# Patient Record
Sex: Male | Born: 1975 | Hispanic: No | Marital: Married | Smoking: Never smoker
Health system: Southern US, Community
[De-identification: ages and names within clinical notes are randomized; demographics above are authoritative.]

## PROBLEM LIST (undated history)

## (undated) DIAGNOSIS — E119 Type 2 diabetes mellitus without complications: Secondary | ICD-10-CM

## (undated) DIAGNOSIS — Z86010 Personal history of colonic polyps: Principal | ICD-10-CM

## (undated) DIAGNOSIS — T7840XA Allergy, unspecified, initial encounter: Secondary | ICD-10-CM

## (undated) HISTORY — DX: Personal history of colonic polyps: Z86.010

## (undated) HISTORY — PX: TONSILLECTOMY: SUR1361

## (undated) HISTORY — DX: Type 2 diabetes mellitus without complications: E11.9

## (undated) HISTORY — DX: Allergy, unspecified, initial encounter: T78.40XA

---

## 2000-11-02 ENCOUNTER — Emergency Department (HOSPITAL_COMMUNITY): Admission: EM | Admit: 2000-11-02 | Discharge: 2000-11-03 | Payer: Self-pay | Admitting: Emergency Medicine

## 2000-11-03 ENCOUNTER — Encounter: Payer: Self-pay | Admitting: Emergency Medicine

## 2008-03-07 ENCOUNTER — Encounter: Admission: RE | Admit: 2008-03-07 | Discharge: 2008-03-07 | Payer: Self-pay | Admitting: Cardiology

## 2008-05-28 ENCOUNTER — Emergency Department (HOSPITAL_COMMUNITY): Admission: EM | Admit: 2008-05-28 | Discharge: 2008-05-28 | Payer: Self-pay | Admitting: Emergency Medicine

## 2009-04-19 ENCOUNTER — Ambulatory Visit: Payer: Self-pay | Admitting: Internal Medicine

## 2009-04-19 DIAGNOSIS — R5381 Other malaise: Secondary | ICD-10-CM | POA: Insufficient documentation

## 2009-04-19 DIAGNOSIS — R5383 Other fatigue: Secondary | ICD-10-CM

## 2009-04-19 LAB — CONVERTED CEMR LAB
ALT: 44 units/L (ref 0–53)
AST: 26 units/L (ref 0–37)
Albumin: 3.8 g/dL (ref 3.5–5.2)
Alkaline Phosphatase: 75 units/L (ref 39–117)
BUN: 18 mg/dL (ref 6–23)
Basophils Absolute: 0 10*3/uL (ref 0.0–0.1)
Basophils Relative: 0 % (ref 0.0–3.0)
Bilirubin Urine: NEGATIVE
Bilirubin, Direct: 0.1 mg/dL (ref 0.0–0.3)
CO2: 30 meq/L (ref 19–32)
Calcium: 8.9 mg/dL (ref 8.4–10.5)
Chloride: 104 meq/L (ref 96–112)
Creatinine, Ser: 0.9 mg/dL (ref 0.4–1.5)
Eosinophils Absolute: 0.2 10*3/uL (ref 0.0–0.7)
Eosinophils Relative: 2 % (ref 0.0–5.0)
GFR calc non Af Amer: 103.58 mL/min (ref >60)
Glucose, Bld: 127 mg/dL — ABNORMAL HIGH (ref 70–99)
HCT: 41.5 % (ref 39.0–52.0)
Hemoglobin, Urine: NEGATIVE
Hemoglobin: 14 g/dL (ref 13.0–17.0)
Ketones, ur: NEGATIVE mg/dL
Leukocytes, UA: NEGATIVE
Lymphocytes Relative: 22 % (ref 12.0–46.0)
Lymphs Abs: 1.8 10*3/uL (ref 0.7–4.0)
MCHC: 33.8 g/dL (ref 30.0–36.0)
MCV: 77.4 fL — ABNORMAL LOW (ref 78.0–100.0)
Monocytes Absolute: 0.6 10*3/uL (ref 0.1–1.0)
Monocytes Relative: 7.8 % (ref 3.0–12.0)
Neutro Abs: 5.5 10*3/uL (ref 1.4–7.7)
Neutrophils Relative %: 68.2 % (ref 43.0–77.0)
Nitrite: NEGATIVE
Platelets: 247 10*3/uL (ref 150.0–400.0)
Potassium: 3.9 meq/L (ref 3.5–5.1)
RBC: 5.37 M/uL (ref 4.22–5.81)
RDW: 12.5 % (ref 11.5–14.6)
Sed Rate: 7 mm/hr (ref 0–22)
Sodium: 138 meq/L (ref 135–145)
Specific Gravity, Urine: 1.025 (ref 1.000–1.030)
TSH: 1.34 microintl units/mL (ref 0.35–5.50)
Total Bilirubin: 0.8 mg/dL (ref 0.3–1.2)
Total Protein, Urine: NEGATIVE mg/dL
Total Protein: 6.9 g/dL (ref 6.0–8.3)
Urine Glucose: NEGATIVE mg/dL
Urobilinogen, UA: 0.2 (ref 0.0–1.0)
WBC: 8.1 10*3/uL (ref 4.5–10.5)
pH: 6 (ref 5.0–8.0)

## 2009-12-10 ENCOUNTER — Ambulatory Visit: Payer: Self-pay | Admitting: Internal Medicine

## 2010-10-01 ENCOUNTER — Encounter: Payer: Self-pay | Admitting: Internal Medicine

## 2010-10-01 ENCOUNTER — Ambulatory Visit: Payer: Self-pay | Admitting: Internal Medicine

## 2010-10-01 DIAGNOSIS — G4730 Sleep apnea, unspecified: Secondary | ICD-10-CM | POA: Insufficient documentation

## 2010-10-01 LAB — CONVERTED CEMR LAB
ALT: 41 units/L (ref 0–53)
AST: 30 units/L (ref 0–37)
Albumin: 4 g/dL (ref 3.5–5.2)
Alkaline Phosphatase: 76 units/L (ref 39–117)
BUN: 15 mg/dL (ref 6–23)
Basophils Absolute: 0 10*3/uL (ref 0.0–0.1)
Basophils Relative: 0.4 % (ref 0.0–3.0)
Bilirubin, Direct: 0 mg/dL (ref 0.0–0.3)
CO2: 30 meq/L (ref 19–32)
Calcium: 9.1 mg/dL (ref 8.4–10.5)
Chloride: 101 meq/L (ref 96–112)
Cholesterol: 193 mg/dL (ref 0–200)
Creatinine, Ser: 0.9 mg/dL (ref 0.4–1.5)
Eosinophils Absolute: 0.5 10*3/uL (ref 0.0–0.7)
Eosinophils Relative: 7.1 % — ABNORMAL HIGH (ref 0.0–5.0)
GFR calc non Af Amer: 101.37 mL/min (ref >60.00)
Glucose, Bld: 102 mg/dL — ABNORMAL HIGH (ref 70–99)
HCT: 42.7 % (ref 39.0–52.0)
HDL: 33.3 mg/dL — ABNORMAL LOW (ref >39.00)
Hemoglobin: 14.5 g/dL (ref 13.0–17.0)
LDL Cholesterol: 136 mg/dL — ABNORMAL HIGH (ref 0–99)
Lymphocytes Relative: 38.9 % (ref 12.0–46.0)
Lymphs Abs: 2.7 10*3/uL (ref 0.7–4.0)
MCHC: 34 g/dL (ref 30.0–36.0)
MCV: 78.3 fL (ref 78.0–100.0)
Monocytes Absolute: 0.6 10*3/uL (ref 0.1–1.0)
Monocytes Relative: 8 % (ref 3.0–12.0)
Neutro Abs: 3.2 10*3/uL (ref 1.4–7.7)
Neutrophils Relative %: 45.6 % (ref 43.0–77.0)
Platelets: 270 10*3/uL (ref 150.0–400.0)
Potassium: 4.8 meq/L (ref 3.5–5.1)
RBC: 5.45 M/uL (ref 4.22–5.81)
RDW: 13.3 % (ref 11.5–14.6)
Sodium: 138 meq/L (ref 135–145)
TSH: 1.46 microintl units/mL (ref 0.35–5.50)
Total Bilirubin: 0.6 mg/dL (ref 0.3–1.2)
Total CHOL/HDL Ratio: 6
Total Protein: 6.9 g/dL (ref 6.0–8.3)
Triglycerides: 119 mg/dL (ref 0.0–149.0)
VLDL: 23.8 mg/dL (ref 0.0–40.0)
WBC: 7 10*3/uL (ref 4.5–10.5)

## 2010-10-25 ENCOUNTER — Ambulatory Visit: Admit: 2010-10-25 | Payer: Self-pay | Admitting: Pulmonary Disease

## 2010-10-30 ENCOUNTER — Encounter: Payer: Self-pay | Admitting: Internal Medicine

## 2010-11-05 NOTE — Assessment & Plan Note (Signed)
Summary: COUGH,COLD,SORE THROAT/CD   Vital Signs:  Patient profile:   35 year old male Height:      71 inches Weight:      209.25 pounds BMI:     29.29 O2 Sat:      97 % on Room air Temp:     98 degrees F oral Pulse rate:   74 / minute Pulse rhythm:   regular Resp:     16 per minute BP sitting:   118 / 80  (left arm) Cuff size:   large  Vitals Entered By: Rock Nephew CMA (December 10, 2009 11:27 AM)  Nutrition Counseling: Patient's BMI is greater than 25 and therefore counseled on weight management options.  O2 Flow:  Room air CC: chills, cough w/ green mucus, bodyache, sore throat, Bilater shoulder pain x several days, URI symptoms   Primary Care Provider:  Etta Grandchild MD  CC:  chills, cough w/ green mucus, bodyache, sore throat, Bilater shoulder pain x several days, and URI symptoms.  History of Present Illness:  URI Symptoms      This is a 35 year old man who presents with URI symptoms.  The symptoms began 3 days ago.  The severity is described as mild.  The patient reports sore throat, productive cough, and sick contacts, but denies nasal congestion, clear nasal discharge, purulent nasal discharge, and earache.  Associated symptoms include low-grade fever (<100.5 degrees).  The patient denies stiff neck, dyspnea, wheezing, rash, vomiting, and diarrhea.  The patient also reports muscle aches.  The patient denies headache and severe fatigue.  The patient denies the following risk factors for Strep sinusitis: unilateral facial pain, unilateral nasal discharge, poor response to decongestant, tooth pain, Strep exposure, tender adenopathy, and absence of cough.    Preventive Screening-Counseling & Management  Alcohol-Tobacco     Alcohol drinks/day: <1     Alcohol type: beer     >5/day in last 3 mos: no     Alcohol Counseling: not indicated; use of alcohol is not excessive or problematic     Feels need to cut down: no     Feels annoyed by complaints: no     Feels guilty  re: drinking: no     Needs 'eye opener' in am: no     Smoking Status: never  Hep-HIV-STD-Contraception     Hepatitis Risk: no risk noted     HIV Risk: no risk noted     STD Risk: no risk noted     TSE monthly: yes      Sexual History:  currently monogamous.        Drug Use:  no.        Blood Transfusions:  no.    Clinical Review Panels:  CBC   WBC:  8.1 (04/19/2009)   RBC:  5.37 (04/19/2009)   Hgb:  14.0 (04/19/2009)   Hct:  41.5 (04/19/2009)   Platelets:  247.0 (04/19/2009)   MCV  77.4 (04/19/2009)   MCHC  33.8 (04/19/2009)   RDW  12.5 (04/19/2009)   PMN:  68.2 (04/19/2009)   Lymphs:  22.0 (04/19/2009)   Monos:  7.8 (04/19/2009)   Eosinophils:  2.0 (04/19/2009)   Basophil:  0.0 (04/19/2009)  Complete Metabolic Panel   Glucose:  127 (04/19/2009)   Sodium:  138 (04/19/2009)   Potassium:  3.9 (04/19/2009)   Chloride:  104 (04/19/2009)   CO2:  30 (04/19/2009)   BUN:  18 (04/19/2009)   Creatinine:  0.9 (04/19/2009)  Albumin:  3.8 (04/19/2009)   Total Protein:  6.9 (04/19/2009)   Calcium:  8.9 (04/19/2009)   Total Bili:  0.8 (04/19/2009)   Alk Phos:  75 (04/19/2009)   SGPT (ALT):  44 (04/19/2009)   SGOT (AST):  26 (04/19/2009)   Medications Prior to Update: 1)  None  Current Medications (verified): 1)  Zithromax Tri-Pak 500 Mg Tab (Azithromycin) .... Take As Directed One By Mouth Once Daily For 3 Days 2)  Guiatuss Ac 100-10 Mg/31ml Syrp (Guaifenesin-Codeine) .... 5-10 Ml By Mouth Qid As Needed For Cough  Allergies (verified): No Known Drug Allergies  Past History:  Past Medical History: Reviewed history from 04/19/2009 and no changes required. Unremarkable  Past Surgical History: Reviewed history from 04/19/2009 and no changes required. Tonsillectomy  Family History: Reviewed history from 04/19/2009 and no changes required. Family History of Colon CA 1st degree relative <60 Family History Diabetes 1st degree relative Family History  Hypertension Family History of Prostate CA 1st degree relative <50  Social History: Reviewed history from 04/19/2009 and no changes required. 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    Physical Exam  General:  alert, well-developed, well-nourished, well-hydrated, appropriate dress, normal appearance, and healthy-appearing.   Head:  normocephalic, atraumatic, no abnormalities observed, and no abnormalities palpated.   Eyes:  vision grossly intact, pupils equal, pupils round, and pupils reactive to light.   Ears:  R ear normal and L ear normal.   Nose:  External nasal examination shows no deformity or inflammation. Nasal mucosa are pink and moist without lesions or exudates. Mouth:  no exudates, no posterior lymphoid hypertrophy, no postnasal drip, no pharyngeal crowing, no lesions, no aphthous ulcers, no erosions, no tongue abnormalities, no leukoplakia, no petechiae, and pharyngeal erythema.   Neck:  supple, full ROM, no masses, no thyromegaly, no JVD, no cervical lymphadenopathy, and no neck tenderness.   Lungs:  normal respiratory effort, no intercostal retractions, no accessory muscle use, normal breath sounds, no dullness, no fremitus, no crackles, and no wheezes.   Heart:  normal rate, regular rhythm, no murmur, no gallop, no rub, and no JVD.   Abdomen:  soft, non-tender, normal bowel sounds, no distention, no masses, no guarding, no rigidity, no rebound tenderness, no hepatomegaly, and no splenomegaly.   Msk:  normal ROM, no joint tenderness, no joint swelling, no joint warmth, no redness over joints, and no joint deformities.   Extremities:  No clubbing, cyanosis, edema, or deformity noted with normal full range of motion of all joints.    Neurologic:  No cranial nerve deficits noted. Station and gait are normal. Plantar reflexes are down-going bilaterally. DTRs are symmetrical throughout. Sensory, motor and coordinative functions appear intact. Skin:  turgor normal, color normal, no rashes, no suspicious lesions, no ecchymoses, no petechiae, no purpura, no ulcerations, and no edema.   Cervical Nodes:  no anterior cervical adenopathy and no posterior cervical adenopathy.   Axillary Nodes:  no R axillary adenopathy and no L axillary adenopathy.   Psych:  Cognition and judgment appear intact. Alert and cooperative with normal attention span and concentration. No apparent delusions, illusions, hallucinations   Impression & Recommendations:  Problem # 1:  BRONCHITIS-ACUTE (ICD-466.0) Assessment New  His updated medication list for this problem includes:    Zithromax Tri-pak 500 Mg Tab (Azithromycin) .Marland KitchenMarland KitchenMarland KitchenMarland Kitchen  Take as directed one by mouth once daily for 3 days    Guiatuss Ac 100-10 Mg/38ml Syrp (Guaifenesin-codeine) .Marland Kitchen... 5-10 ml by mouth qid as needed for cough  Take antibiotics and other medications as directed. Encouraged to push clear liquids, get enough rest, and take acetaminophen as needed. To be seen in 5-7 days if no improvement, sooner if worse.  Complete Medication List: 1)  Zithromax Tri-pak 500 Mg Tab (Azithromycin) .... Take as directed one by mouth once daily for 3 days 2)  Guiatuss Ac 100-10 Mg/81ml Syrp (Guaifenesin-codeine) .... 5-10 ml by mouth qid as needed for cough  Patient Instructions: 1)  Please schedule a follow-up appointment in 2 weeks. 2)  Acute bronchitis symptoms for less than 10 days are not helped by antibiotics. take over the counter cough medications. call if no improvment in  5-7 days, sooner if increasing cough, fever, or new symptoms( shortness of breath, chest pain). Prescriptions: GUIATUSS AC 100-10 MG/5ML SYRP (GUAIFENESIN-CODEINE) 5-10 ml by mouth qid as needed for cough  #6 ounces x 1    Entered and Authorized by:   Etta Grandchild MD   Signed by:   Etta Grandchild MD on 12/10/2009   Method used:   Print then Give to Patient   RxID:   [id number] ZITHROMAX TRI-PAK 500 MG TAB (AZITHROMYCIN) Take as directed one by mouth once daily for 3 days  #3 x 0   Entered and Authorized by:   Etta Grandchild MD   Signed by:   Etta Grandchild MD on 12/10/2009   Method used:   Print then Give to Patient   RxID:   [id number]

## 2010-11-07 NOTE — Letter (Signed)
Summary: Primary Care Appointment Letter  [facility]  [address] [place], [place] [address]   Phone: [phone]  Fax: [phone]    10/30/2010 MRN: [id number]  [facility] [address] [place], [place]  [address]  Dear Mr. KEHOE,   Your Primary Care Physician Etta Grandchild MD has indicated that:    _______it is time to schedule an appointment.    _______you missed your appointment on______ and need to call and          reschedule.    _______you need to have lab work done.    _______you need to schedule an appointment discuss lab or test results.    ____X___you need to call to reschedule your appointment that is                       scheduled on Nov 21, 2010 with Dr.Dustin Bumbaugh.  Please call the office.     Please call our office as soon as possible. Our phone number is [phone]. Please press option 1. Our office is open 8a-12noon and 1p-5p, Monday through Friday.     Thank you,    [facility]

## 2010-11-07 NOTE — Assessment & Plan Note (Signed)
Summary: cpx/#/cd   Vital Signs:  Patient profile:   35 year old male Weight:      207 pounds BMI:     28.97 O2 Sat:      98 % on Room air Temp:     98.1 degrees F oral Pulse rate:   64 / minute Pulse rhythm:   regular Resp:     16 per minute BP sitting:   108 / 72  (left arm) Cuff size:   large  Vitals Entered By: Lamar Sprinkles, CMA (October 01, 2010 10:01 AM)  Nutrition Counseling: Patient's BMI is greater than 25 and therefore counseled on weight management options.  O2 Flow:  Room air CC: "check up", Preventive Care Is Patient Diabetic? No Pain Assessment Patient in pain? no        Primary Care Provider:  Etta Grandchild MD  CC:  "check up" and Preventive Care.  History of Present Illness: He returns for a complete physical but has some complaints.  Preventive Screening-Counseling & Management  Alcohol-Tobacco     Alcohol drinks/day: <1     Alcohol type: beer     >5/day in last 3 mos: no     Alcohol Counseling: not indicated; use of alcohol is not excessive or problematic     Feels need to cut down: no     Feels annoyed by complaints: no     Feels guilty re: drinking: no     Needs 'eye opener' in am: no     Smoking Status: never     Tobacco Counseling: not indicated; no tobacco use  Hep-HIV-STD-Contraception     Hepatitis Risk: no risk noted     HIV Risk: no risk noted     STD Risk: no risk noted     TSE monthly: yes  Safety-Violence-Falls     Seat Belt Use: yes     Helmet Use: n/a     Firearms in the Home: firearms in the home     Smoke Detectors: no     Violence in the Home: no risk noted     Sexual Abuse: no      Sexual History:  currently monogamous.        Drug Use:  no.        Blood Transfusions:  no.    Clinical Review Panels:  Diabetes Management   Creatinine:  0.9 (04/19/2009)  CBC   WBC:  8.1 (04/19/2009)   RBC:  5.37 (04/19/2009)   Hgb:  14.0 (04/19/2009)   Hct:  41.5 (04/19/2009)   Platelets:  247.0 (04/19/2009)   MCV   77.4 (04/19/2009)   MCHC  33.8 (04/19/2009)   RDW  12.5 (04/19/2009)   PMN:  68.2 (04/19/2009)   Lymphs:  22.0 (04/19/2009)   Monos:  7.8 (04/19/2009)   Eosinophils:  2.0 (04/19/2009)   Basophil:  0.0 (04/19/2009)  Complete Metabolic Panel   Glucose:  127 (04/19/2009)   Sodium:  138 (04/19/2009)   Potassium:  3.9 (04/19/2009)   Chloride:  104 (04/19/2009)   CO2:  30 (04/19/2009)   BUN:  18 (04/19/2009)   Creatinine:  0.9 (04/19/2009)   Albumin:  3.8 (04/19/2009)   Total Protein:  6.9 (04/19/2009)   Calcium:  8.9 (04/19/2009)   Total Bili:  0.8 (04/19/2009)   Alk Phos:  75 (04/19/2009)   SGPT (ALT):  44 (04/19/2009)   SGOT (AST):  26 (04/19/2009)   Current Medications (verified): 1)  None  Allergies (verified): No Known Drug  Allergies  Past History:  Past Medical History: Last updated: 04/19/2009 Unremarkable  Past Surgical History: Last updated: 04/19/2009 Tonsillectomy  Family History: Last updated: 04/19/2009 Family History of Colon CA 1st degree relative <60 Family History Diabetes 1st degree relative Family History Hypertension Family History of Prostate CA 1st degree relative <50  Social History: Last updated: 04/19/2009 Occupation: Production designer, 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 Family History of Colon CA 1st degree relative <60 Family History Diabetes 1st degree relative Family History Hypertension Family History of Prostate CA 1st degree relative <50  Social History: Reviewed history from 04/19/2009 and no changes required. 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  The patient denies anorexia, fever, weight loss, chest pain, syncope, dyspnea on exertion, peripheral edema, prolonged cough, headaches, hemoptysis, abdominal pain, melena, hematochezia, severe indigestion/heartburn, hematuria, muscle weakness, suspicious skin lesions, transient blindness, difficulty walking, depression, unusual weight change, abnormal bleeding, enlarged lymph nodes, angioedema, and testicular masses.   Resp:  Complains of excessive snoring, hypersomnolence, and morning headaches; denies chest discomfort, chest pain with inspiration, cough, coughing up blood, pleuritic, shortness of breath, sputum productive, and wheezing.  Physical Exam  General:  alert, well-developed, well-nourished, well-hydrated, appropriate dress, normal appearance, and healthy-appearing.   Head:  normocephalic, atraumatic, no abnormalities observed, and no abnormalities palpated.   Eyes:  vision grossly intact, pupils equal, pupils round, and pupils reactive to light.   Ears:  R ear normal and L ear normal.   Mouth:  no exudates, no posterior lymphoid hypertrophy, no postnasal drip, no pharyngeal crowing, no lesions, no aphthous ulcers, no erosions, no tongue abnormalities, no leukoplakia, no petechiae, and pharyngeal erythema.   Neck:  supple, full ROM, no masses, no thyromegaly, no JVD, no cervical lymphadenopathy, and no neck tenderness.   Chest Wall:  No deformities, masses, tenderness or gynecomastia noted. Breasts:  No masses or gynecomastia noted Lungs:  normal respiratory effort, no intercostal retractions, no accessory muscle use, normal breath sounds, no dullness, no fremitus, no crackles, and no wheezes.   Heart:  normal rate, regular rhythm, no murmur, no gallop, no rub, and no JVD.   Abdomen:  soft, non-tender, normal bowel sounds, no distention, no masses, no guarding, no rigidity, no rebound tenderness, no hepatomegaly, and  no splenomegaly.   Genitalia:  uncircumcised, no hydrocele, no varicocele, no scrotal masses, no testicular masses or atrophy, no cutaneous lesions, and no urethral discharge.   Msk:  No deformity or scoliosis noted of thoracic or lumbar spine.   Pulses:  R and L carotid,radial,femoral,dorsalis pedis and posterior tibial pulses are full and equal bilaterally Extremities:  No clubbing, cyanosis, edema, or deformity noted with normal full range of motion of all joints.   Neurologic:  No cranial nerve deficits noted. Station and gait are normal. Plantar reflexes are down-going bilaterally. DTRs are symmetrical throughout. Sensory, motor and coordinative functions appear intact. Skin:  Intact without suspicious lesions or rashes Cervical Nodes:  no anterior cervical adenopathy and no posterior cervical adenopathy.   Axillary Nodes:  no R axillary adenopathy and no L axillary adenopathy.   Inguinal Nodes:  no R inguinal adenopathy and no L inguinal adenopathy.   Psych:  Cognition and judgment appear intact. Alert and cooperative with normal attention span and concentration. No apparent delusions, illusions, hallucinations   Impression & Recommendations:  Problem # 1:  ROUTINE GENERAL MEDICAL EXAM@HEALTH  CARE FACL (ICD-V70.0) Assessment New  Orders: Venipuncture (82956) TLB-Lipid Panel (80061-LIPID) TLB-BMP (Basic Metabolic Panel-BMET) (80048-METABOL) TLB-CBC Platelet - w/Differential (85025-CBCD) TLB-Hepatic/Liver Function Pnl (80076-HEPATIC) TLB-TSH (Thyroid Stimulating Hormone) (84443-TSH) EKG w/ Interpretation (93000)  Problem # 2:  SLEEP APNEA (ICD-780.57) Assessment: New  Orders: Venipuncture (21308) TLB-Lipid Panel (80061-LIPID) TLB-BMP (Basic Metabolic Panel-BMET) (80048-METABOL) TLB-CBC Platelet - w/Differential (85025-CBCD) TLB-Hepatic/Liver Function Pnl (80076-HEPATIC) TLB-TSH (Thyroid Stimulating Hormone) (84443-TSH) EKG w/ Interpretation (93000) Sleep Disorder Referral  (Sleep Disorder)  Problem # 3:  FAMILY HISTORY OF COLON CA 1ST DEGREE RELATIVE <60 (ICD-V16.0) Assessment: Unchanged he needs a screening colonoscopy Orders: Gastroenterology Referral (GI)  Patient Instructions: 1)  Please schedule a follow-up appointment in 2 months. 2)  It is important that you exercise regularly at least 20 minutes 5 times a week. If you develop chest pain, have severe difficulty breathing, or feel very tired , stop exercising immediately and seek medical attention. 3)  You need to lose weight. Consider a lower calorie diet and regular exercise.  4)  Schedule a colonoscopy/sigmoidoscopy to help detect colon cancer.   Orders Added: 1)  Venipuncture [36415] 2)  TLB-Lipid Panel [80061-LIPID] 3)  TLB-BMP (Basic Metabolic Panel-BMET) [80048-METABOL] 4)  TLB-CBC Platelet - w/Differential [85025-CBCD] 5)  TLB-Hepatic/Liver Function Pnl [80076-HEPATIC] 6)  TLB-TSH (Thyroid Stimulating Hormone) [84443-TSH] 7)  EKG w/ Interpretation [93000] 8)  Sleep Disorder Referral [Sleep Disorder] 9)  Gastroenterology Referral [GI] 10)  Est. Patient 18-39 years [99395] 44)  Est. Patient Level III [65784]

## 2010-11-07 NOTE — Letter (Signed)
Summary: Lipid Letter  [facility]  [address] [place], [place] [address]   Phone: [phone]  Fax: [phone]    10/01/2010  [facility] [place] [address] [place], [place]  [address]  Dear Ninfa Linden:  We have carefully reviewed your last lipid profile from 10/01/2010 and the results are noted below with a summary of recommendations for lipid management.    Cholesterol:       193     Goal: <200   HDL "good" Cholesterol:   57.84     Goal: >40   LDL "bad" Cholesterol:   136     Goal: <130   Triglycerides:       119.0     Goal: <150    blood sugar is a little high, other labs look good    TLC Diet (Therapeutic Lifestyle Change): Saturated Fats & Transfatty acids should be kept < 7% of total calories ***Reduce Saturated Fats Polyunstaurated Fat can be up to 10% of total calories Monounsaturated Fat Fat can be up to 20% of total calories Total Fat should be no greater than 25-35% of total calories Carbohydrates should be 50-60% of total calories Protein should be approximately 15% of total calories Fiber should be at least 20-30 grams a day ***Increased fiber may help lower LDL Total Cholesterol should be < 200mg /day Consider adding plant stanol/sterols to diet (example: Benacol spread) ***A higher intake of unsaturated fat may reduce Triglycerides and Increase HDL    Adjunctive Measures (may lower LIPIDS and reduce risk of Heart Attack) include: Aerobic Exercise (20-30 minutes 3-4 times a week) Limit Alcohol Consumption Weight Reduction Aspirin 75-81 mg a day by mouth (if not allergic or contraindicated) Dietary Fiber 20-30 grams a day by mouth     Current Medications:  None If you have any questions, please call. We appreciate being able to work with you.   Sincerely,    [facility] Etta Grandchild MD

## 2010-11-12 ENCOUNTER — Encounter: Payer: Self-pay | Admitting: Pulmonary Disease

## 2010-11-12 ENCOUNTER — Institutional Professional Consult (permissible substitution) (INDEPENDENT_AMBULATORY_CARE_PROVIDER_SITE_OTHER): Payer: Commercial Managed Care - PPO | Admitting: Pulmonary Disease

## 2010-11-12 DIAGNOSIS — R0989 Other specified symptoms and signs involving the circulatory and respiratory systems: Secondary | ICD-10-CM

## 2010-11-12 DIAGNOSIS — J309 Allergic rhinitis, unspecified: Secondary | ICD-10-CM | POA: Insufficient documentation

## 2010-11-12 DIAGNOSIS — R0609 Other forms of dyspnea: Secondary | ICD-10-CM

## 2010-11-21 NOTE — Assessment & Plan Note (Signed)
Summary: consult for possible osa   Copy to:  Sanda Linger Primary Provider/Referring Provider:  Etta Grandchild MD  CC:  Sleep Consult.  History of Present Illness: The pt is a 34y/o male who I have been asked to see for possible osa.  He has been noted to have loud snoring by his wife, and also an abnormal breathing pattern during sleep.  He denies any choking arousals.  He goes to bed after midnight, and sometimes takes up to an hour to fall asleep.  He starts his day at 630am, and is more unrested than rested when he arises.  He notes decreased concentration and focus during the day with inactivity, especially if reading.  He can sometimes have frank sleepiness.  He denies any issue watching tv or with driving.  His weight is neutral over the last 2 yrs per the pt, and his epworth score today is only 6.    Medications Prior to Update: 1)  None  Allergies (verified): No Known Drug Allergies  Past History:  Past Medical History:   ALLERGIC RHINITIS (ICD-477.9)     Past Surgical History: Reviewed history from 04/19/2009 and no changes required. Tonsillectomy  Family History: Reviewed history from 04/19/2009 and no changes required. Family History of Colon CA 1st degree relative <60.Marland Kitchen.father Family History Diabetes 1st degree relative.....mother Family History Hypertension.....mother Family History of Prostate CA 1st degree relative <50  Social History: Reviewed history from 04/19/2009 and no changes required. 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 Never Smoked Alcohol use-no Drug use-no Regular exercise-yes  Review of Systems       The patient complains of productive cough, headaches, nasal congestion/difficulty breathing through nose, and sneezing.  The patient denies shortness of breath with activity, shortness of breath at rest, non-productive cough, coughing up blood, chest pain, irregular heartbeats, acid heartburn, indigestion, loss of  appetite, weight change, abdominal pain, difficulty swallowing, sore throat, tooth/dental problems, itching, ear ache, anxiety, depression, hand/feet swelling, joint stiffness or pain, rash, change in color of mucus, and fever.    Vital Signs:  Patient profile:   35 year old male Height:      71 inches Weight:      207 pounds BMI:     28.97 O2 Sat:      95 % on Room air Temp:     98.2 degrees F oral Pulse rate:   73 / minute BP sitting:   102 / 80  (left arm) Cuff size:   regular  Vitals Entered By: Arman Filter LPN (November 12, 2010 1:34 PM)  O2 Flow:  Room air  Physical Exam  General:  wd male in nad Eyes:  EOMI.  left pupil much larger than right  both reactive Nose:  deviated septum to left with near obstruction Mouth:  moderate elongation of soft palate and uvula Neck:  no jvd, tmg, LN Lungs:  clear to auscultation. no wheezing or rhonchi Heart:  rrr, no mrg Abdomen:  soft and nontender, bs+ Extremities:  no edema or cyanosis  pulses intact distally Neurologic:  awake, but does appear sleepy. moves all 4.   Impression & Recommendations:  Problem # 1:  SNORING (ICD-786.09) the pt has snoring, witnessed pauses during the night by his wife, and nonrestorative sleep.  He does have some concentration and focus issues during the day, but not a lot of frank sleepiness unless trying to read.  He does not get enough sleep at night, and I have asked him  to work on this.  It is unclear if he has true sleep apnea, and would benefit from sleep testing.  He has no significant medical problems, and would be a good candidate for home study.  The pt is agreeable.  Other Orders: Consultation Level IV ([address]) Misc. Referral (Misc. Ref)  Patient Instructions: 1)  will do home sleep testing to evaluate for sleep apnea. 2)  will arrange for followup once results are available.

## 2010-11-28 ENCOUNTER — Ambulatory Visit: Payer: Self-pay | Admitting: Internal Medicine

## 2010-12-20 ENCOUNTER — Ambulatory Visit (INDEPENDENT_AMBULATORY_CARE_PROVIDER_SITE_OTHER): Payer: Commercial Managed Care - PPO | Admitting: Pulmonary Disease

## 2010-12-20 ENCOUNTER — Encounter: Payer: Self-pay | Admitting: Pulmonary Disease

## 2010-12-20 DIAGNOSIS — R0609 Other forms of dyspnea: Secondary | ICD-10-CM

## 2010-12-30 ENCOUNTER — Ambulatory Visit: Payer: Commercial Managed Care - PPO | Admitting: Internal Medicine

## 2011-01-02 NOTE — Assessment & Plan Note (Signed)
Summary: home sleep testing with AHI 5/hr.    Copy to:  Sanda Linger Primary Provider/Referring Provider:  Etta Grandchild MD   History of Present Illness: The pt underwent home sleep testing with a type 3 monitoring device.  Airflow, pulse rate, oxygen saturations, and respiratory effort were all monitored.  The pt's flow and saturation evaluation periods were only 4hrs and .  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 2) there was oxygen desaturation as low as 79%, with only 1 minute spent the entire night less than or      equal to 88%.   3) average pulse rate during the night was 75.    Allergies: No Known Drug Allergies   Impression & Recommendations:  Problem # 1:  SNORING (ICD-786.09)  the pt has no significant osa by his recent sleep study.  I would encourage him to increase his total sleep time, and to consider surgery if he was concerned about his loud snoring given his upper airway anatomy.   Other Orders: Sleep Std Airflow/Heartrate and O2 SAT unattended ([id number])  Appended Document: home sleep testing with AHI 5/hr.  please let pt know that his sleep study does not show sleep apnea....if he wishes to treat his snoring, can work on mild weight loss, try to stay off his back while sleeping, and can consider surgery given his nasal obstruction on exam.  let me know if he has any questions, and can send him to ent if he wishes to consider surgery.    Appended Document: home sleep testing with AHI 5/hr.  [id number]  Appended Document: home sleep testing with AHI 5/hr.  called and spoke with pt.  pt aware of apnea link results. pt is going to work on weight loss and staying off his back while sleeping.  pt states he will call us back if he would like to be referred to ENT for eval.

## 2011-01-22 ENCOUNTER — Encounter: Payer: Self-pay | Admitting: Pulmonary Disease

## 2011-01-27 ENCOUNTER — Ambulatory Visit (INDEPENDENT_AMBULATORY_CARE_PROVIDER_SITE_OTHER): Payer: Commercial Managed Care - PPO | Admitting: Internal Medicine

## 2011-01-27 ENCOUNTER — Encounter: Payer: Self-pay | Admitting: Internal Medicine

## 2011-01-27 DIAGNOSIS — R143 Flatulence: Secondary | ICD-10-CM

## 2011-01-27 DIAGNOSIS — R141 Gas pain: Secondary | ICD-10-CM

## 2011-01-27 DIAGNOSIS — Z800 Family history of malignant neoplasm of digestive organs: Secondary | ICD-10-CM

## 2011-01-27 DIAGNOSIS — R140 Abdominal distension (gaseous): Secondary | ICD-10-CM | POA: Insufficient documentation

## 2011-01-27 DIAGNOSIS — Z1211 Encounter for screening for malignant neoplasm of colon: Secondary | ICD-10-CM

## 2011-01-27 MED ORDER — PEG-KCL-NACL-NASULF-NA ASC-C 100 G PO SOLR: 1.0000 | ORAL | Status: DC

## 2011-01-27 NOTE — Assessment & Plan Note (Signed)
This started after fiber supplementation was begun.

## 2011-01-27 NOTE — Patient Instructions (Signed)
You have been scheduled for a Colonoscopy, instructions have been provided. Please pick up your prep solution at your pharmacy.  Colonoscopy A colonoscopy is an exam to evaluate your entire colon. In this exam, your colon is cleansed. A long fiberoptic tube is inserted through your rectum and into your colon. The fiberoptic scope (endoscope) is a long bundle of enclosed and very flexible fibers. These fibers transmit light to the area examined and send images from that area to your caregiver. Discomfort is usually minimal. You may be given a drug to help you sleep (sedative) during or prior to the procedure. This exam helps to detect lumps (tumors), polyps, inflammation, and areas of bleeding. Your caregiver may also take a small piece of tissue (biopsy) that will be examined under a microscope. BEFORE THE PROCEDURE  A clear liquid diet may be required for 2 days before the exam.   Liquid injections (enemas) or laxatives may be required.   A large amount of electrolyte solution may be given to you to drink over a short period of time. This solution is used to clean out your colon.   You should be present prior to your procedure or as directed by your caregiver.   Check in at the admissions desk to fill out necessary forms if not preregistered. There will be consent forms to sign prior to the procedure. If accompanied by friends or family, there is a waiting area for them while you are having your procedure.  LET YOUR CAREGIVER KNOW ABOUT:  Allergies to food or medicine.  Medicines taken, including vitamins, herbs, eyedrops, over-the-counter medicines, and creams.   Use of steroids (by mouth or creams).   Previous problems with anesthetics or numbing medicines.   History of bleeding problems or blood clots.  Previous surgery.   Other health problems, including diabetes and kidney problems.   Possibility of pregnancy, if this applies.   AFTER THE PROCEDURE  If you received a sedative  and/or pain medicine, you will need to arrange for someone to drive you home.   Occasionally, there is a little blood passed with the first bowel movement. DO NOT be concerned.  HOME CARE INSTRUCTIONS  It is not unusual to pass moderate amounts of gas and experience mild abdominal cramping following the procedure. This is due to air being used to inflate your colon during the exam. Walking or a warm pack on your belly (abdomen) may help.   You may resume all normal meals and activities after sedatives and medicines have worn off.   Only take over-the-counter or prescription medicines for pain, discomfort, or fever as directed by your caregiver. DO NOT use aspirin or blood thinners if a biopsy was taken. Consult your caregiver for medicine usage if biopsies were taken.  FINDING OUT THE RESULTS OF YOUR TEST Not all test results are available during your visit. If your test results are not back during the visit, make an appointment with your caregiver to find out the results. Do not assume everything is normal if you have not heard from your caregiver or the medical facility. It is important for you to follow up on all of your test results. SEEK IMMEDIATE MEDICAL CARE IF:    You pass large blood clots or fill a toilet with blood following the procedure. This may also occur 10 to 14 days following the procedure. This is more likely if a biopsy was taken.   You develop abdominal pain that keeps getting worse and cannot be relieved  with medicine.  Document Released: 09/19/2000 Document Re-Released: 12/17/2009 [facility] Patient Information 2011 [place], [place].

## 2011-01-27 NOTE — Progress Notes (Signed)
History of Present Illness: This is a 35 year old man originally from [place]. About 2 months ago he initiated over-the-counter fiber supplementation as had some intermittent bloating and abdominal distention since then. This lasts a few hours and then seems to be relieved by passing flatus. He has no other GI problems at this time. His father died around the age of 51 from colon cancer. This was diagnosed in around. The patient is concerned about his chance of developing colon cancer.    Review of Systems: Pertinent positive and negative review of systems were noted in the above HPI section. All other review of systems were otherwise negative.    Current Medications, Allergies, Past Medical History, Past Surgical History, Family History and Social History were reviewed in [employer] record.   Physical Exam: General: Well developed , well nourished, no acute distress Head: Normocephalic and atraumatic Eyes:  sclerae anicteric Lungs: Clear throughout to auscultation Heart: Regular rate and rhythm; no murmurs, rubs or bruits Abdomen: Soft, non tender and non distended. No masses, hepatosplenomegaly or hernias noted. Normal Bowel sounds Rectal:  Deferred until colonoscopy   Assessment and Recommendations:  #1  Abdominal distention: I think this is clearly related to the fiber at this point. He may need to stop this. He was not constipated I think this was a concern about protecting himself from colon cancer.  #2 Family history of colon cancer in a relative less than 60, it was his father. He is at an appropriate point to have a screening colonoscopy given his increased risk. We will schedule this. Risks benefits indications are explained he understands and agrees to proceed.

## 2011-01-28 ENCOUNTER — Encounter: Payer: Self-pay | Admitting: Internal Medicine

## 2011-02-05 ENCOUNTER — Ambulatory Visit (INDEPENDENT_AMBULATORY_CARE_PROVIDER_SITE_OTHER): Payer: Commercial Managed Care - PPO | Admitting: Internal Medicine

## 2011-02-05 ENCOUNTER — Encounter: Payer: Self-pay | Admitting: Internal Medicine

## 2011-02-05 VITALS — BP 120/80 | HR 80 | Temp 98.3°F | Resp 16 | Wt 205.0 lb

## 2011-02-05 DIAGNOSIS — J309 Allergic rhinitis, unspecified: Secondary | ICD-10-CM

## 2011-02-05 MED ORDER — MOMETASONE FUROATE 50 MCG/ACT NA SUSP: 2.0000 | Freq: Every day | NASAL | Status: DC

## 2011-02-05 NOTE — Progress Notes (Signed)
Subjective:    Patient ID: Marcus Schultz, male    DOB: 11/16/1975, 35 y.o.   MRN: [id number]  Allergic Reaction This is a chronic problem. The current episode started more than 1 week ago. The problem occurs constantly. The problem is unchanged. The problem is moderate. It is unknown what he was exposed to. The time of exposure is not relevant (no exposure). Associated symptoms include eye itching and eye redness. Pertinent negatives include no abdominal pain, chest pain, chest pressure, coughing, diarrhea, difficulty breathing, drooling, eye watering, globus sensation, hyperventilation, itching, rash, stridor, trouble swallowing, vomiting or wheezing. There is no swelling present. Past treatments include one or more OTC medications. The treatment provided no relief. His past medical history is significant for seasonal allergies.      Review of Systems  Constitutional: Negative for fever, chills, diaphoresis, activity change, appetite change, fatigue and unexpected weight change.  HENT: Positive for congestion, rhinorrhea, sneezing and postnasal drip. Negative for hearing loss, nosebleeds, sore throat, facial swelling, drooling, trouble swallowing, neck pain, neck stiffness, voice change, sinus pressure and tinnitus.   Eyes: Positive for redness and itching. Negative for photophobia, pain, discharge and visual disturbance.  Respiratory: Negative for apnea, cough, choking, chest tightness, shortness of breath, wheezing and stridor.   Cardiovascular: Negative for chest pain, palpitations and leg swelling.  Gastrointestinal: Negative for nausea, vomiting, abdominal pain, diarrhea, constipation and blood in stool.  Musculoskeletal: Negative for myalgias, back pain, joint swelling, arthralgias and gait problem.  Skin: Negative for itching and rash.  Neurological: Negative for dizziness, seizures, facial asymmetry, light-headedness, numbness and headaches.  Hematological: Negative for adenopathy. Does  not bruise/bleed easily.  Psychiatric/Behavioral: Negative for hallucinations, behavioral problems, confusion, self-injury, dysphoric mood, decreased concentration and agitation. The patient is not nervous/anxious and is not hyperactive.        Objective:   Physical Exam  [vitalsreviewed. Constitutional: He is oriented to person, place, and time. He appears well-developed and well-nourished. No distress.  HENT:  Head: Normocephalic and atraumatic.  Right Ear: External ear normal.  Left Ear: External ear normal.  Nose: Mucosal edema and rhinorrhea present. No nose lacerations or sinus tenderness. No epistaxis.  No foreign bodies. Right sinus exhibits no maxillary sinus tenderness and no frontal sinus tenderness. Left sinus exhibits no maxillary sinus tenderness and no frontal sinus tenderness.  Mouth/Throat: Oropharynx is clear and moist. No oropharyngeal exudate.  Eyes: Conjunctivae and EOM are normal. Pupils are equal, round, and reactive to light. Right eye exhibits no discharge. Left eye exhibits no discharge. No scleral icterus.  Neck: Normal range of motion. Neck supple. No JVD present. No tracheal deviation present. No thyromegaly present.  Cardiovascular: Normal rate, regular rhythm, normal heart sounds and intact distal pulses.  Exam reveals no gallop and no friction rub.   No murmur heard. Pulmonary/Chest: Effort normal and breath sounds normal. No stridor. No respiratory distress. He has no wheezes. He has no rales. He exhibits no tenderness.  Abdominal: Soft. Bowel sounds are normal. He exhibits no distension and no mass. There is no tenderness. There is no rebound and no guarding.  Musculoskeletal: Normal range of motion. He exhibits no edema and no tenderness.  Lymphadenopathy:    He has no cervical adenopathy.  Neurological: He is alert and oriented to person, place, and time. He has normal reflexes. He displays normal reflexes. No cranial nerve deficit. He exhibits normal  muscle tone. Coordination normal.  Skin: Skin is warm and dry. No rash noted. He is not diaphoretic.  No erythema. No pallor.  Psychiatric: He has a normal mood and affect. His behavior is normal. Judgment and thought content normal.       Lab Results  Component Value Date   WBC 7.0 10/01/2010   HGB 14.5 10/01/2010   HCT 42.7 10/01/2010   PLT 270.0 10/01/2010   CHOL 193 10/01/2010   TRIG 119.0 10/01/2010   HDL 33.30* 10/01/2010   ALT 41 10/01/2010   AST 30 10/01/2010   NA 138 10/01/2010   K 4.8 10/01/2010   CL 101 10/01/2010   CREATININE 0.9 10/01/2010   BUN 15 10/01/2010   CO2 30 10/01/2010   TSH 1.46 10/01/2010     Assessment & Plan:

## 2011-02-05 NOTE — Assessment & Plan Note (Signed)
He was given an injection of depo-medrol IM and started on a steroid nasal spray

## 2011-02-05 NOTE — Patient Instructions (Signed)
Allergic Rhinitis Allergic rhinitis is when the mucous membranes in the nose respond to allergens. Allergens are particles in the air that cause your body to have an allergic reaction. This causes you to release allergic antibodies. Through a chain of events, these eventually cause you to release histamine into the blood stream (hence the use of antihistamines). Although meant to be protective to the body, it is this release that causes your discomfort, such as frequent sneezing, congestion and an itchy runny nose.  CAUSES The pollen allergens may come from grasses, trees, and weeds. This is seasonal allergic rhinitis, or "hay fever." Other allergens cause year-round allergic rhinitis (perennial allergic rhinitis) such as house dust mite allergen, pet dander and mold spores.  SYMPTOMS  Nasal stuffiness (congestion).   Runny, itchy nose with sneezing and tearing of the eyes.   There is often an itching of the mouth, eyes and ears.  It cannot be cured, but it can be controlled with medications. DIAGNOSIS If you are unable to determine the offending allergen, skin or blood testing may find it. TREATMENT  Avoid the allergen.   Medications and allergy shots (immunotherapy) can help.   Hay fever may often be treated with antihistamines in pill or nasal spray forms. Antihistamines block the effects of histamine. There are over-the-counter medicines that may help with nasal congestion and swelling around the eyes. Check with your caregiver before taking or giving this medicine.  If the treatment above does not work, there are many new medications your caregiver can prescribe. Stronger medications may be used if initial measures are ineffective. Desensitizing injections can be used if medications and avoidance fails. Desensitization is when a patient is given ongoing shots until the body becomes less sensitive to the allergen. Make sure you follow up with your caregiver if problems continue. SEEK  MEDICAL CARE IF:   You develop fever (more than 100.5F (38.1 C).   You develop a cough that does not stop easily (persistent).   You have shortness of breath.   You start wheezing.   Symptoms interfere with normal daily activities.  Document Released: 06/17/2001 Document Re-Released: 10/14/2009 [employer] Patient Information 2011 [employer]. 

## 2011-02-07 DIAGNOSIS — Z860100 Personal history of colon polyps, unspecified: Secondary | ICD-10-CM

## 2011-02-07 HISTORY — DX: Personal history of colonic polyps: Z86.010

## 2011-02-07 HISTORY — DX: Personal history of colon polyps, unspecified: Z86.0100

## 2011-02-11 ENCOUNTER — Telehealth: Payer: Self-pay | Admitting: *Deleted

## 2011-02-11 ENCOUNTER — Encounter: Payer: Self-pay | Admitting: Gastroenterology

## 2011-02-11 MED ORDER — PEG-KCL-NACL-NASULF-NA ASC-C 100 G PO SOLR: 1.0000 | Freq: Once | ORAL | Status: AC

## 2011-02-11 NOTE — Telephone Encounter (Signed)
Pt's colonoscopy scheduled in error.  Spoke with pt, who needs Moviprep sent to [facility] on [address].  Rx sent per pt request.  Also, pt rescheduled to 2:30 on 02-12-11 with Dr. Leone Payor.  Pt is not to be a Propofol pt and Dr. Leone Payor aware.  Pt given instructions re: new times to drink prep and to stop drinking clear liquids and understanding voiced.  Pt to be here at 1:30 p.m.  On 02-12-11.

## 2011-02-12 ENCOUNTER — Other Ambulatory Visit: Payer: Commercial Managed Care - PPO | Admitting: Gastroenterology

## 2011-02-12 ENCOUNTER — Other Ambulatory Visit: Payer: Commercial Managed Care - PPO | Admitting: Internal Medicine

## 2011-02-14 ENCOUNTER — Encounter: Payer: Self-pay | Admitting: Internal Medicine

## 2011-02-17 ENCOUNTER — Ambulatory Visit (AMBULATORY_SURGERY_CENTER): Payer: Commercial Managed Care - PPO | Admitting: Internal Medicine

## 2011-02-17 ENCOUNTER — Encounter: Payer: Self-pay | Admitting: Internal Medicine

## 2011-02-17 VITALS — BP 123/77 | HR 61 | Temp 97.4°F | Resp 20

## 2011-02-17 DIAGNOSIS — R140 Abdominal distension (gaseous): Secondary | ICD-10-CM

## 2011-02-17 DIAGNOSIS — D126 Benign neoplasm of colon, unspecified: Secondary | ICD-10-CM

## 2011-02-17 DIAGNOSIS — Z1211 Encounter for screening for malignant neoplasm of colon: Secondary | ICD-10-CM

## 2011-02-17 DIAGNOSIS — Z800 Family history of malignant neoplasm of digestive organs: Secondary | ICD-10-CM

## 2011-02-17 HISTORY — PX: COLONOSCOPY W/ POLYPECTOMY: SHX1380

## 2011-02-17 LAB — HM COLONOSCOPY

## 2011-02-17 MED ORDER — SODIUM CHLORIDE 0.9 % IV SOLN: 500.0000 mL | INTRAVENOUS | Status: DC

## 2011-02-18 ENCOUNTER — Telehealth: Payer: Self-pay

## 2011-02-18 ENCOUNTER — Telehealth: Payer: Self-pay | Admitting: Internal Medicine

## 2011-02-18 NOTE — Telephone Encounter (Signed)
I called the pt's home and cell #.  No ID on the recordings.  No message left. MAW

## 2011-02-18 NOTE — Telephone Encounter (Signed)
Advised pt to drink something warm to be sure all air has passed, wait a couple of hours then call again if still no improvement.

## 2011-02-25 NOTE — Progress Notes (Signed)
Quick Note:  2 adenomas (tubular and serrated + family hx colon cancer Repeat colonoscopy May 2017 ______

## 2011-07-11 ENCOUNTER — Telehealth: Payer: Self-pay

## 2011-07-11 NOTE — Telephone Encounter (Signed)
[facility] requesting referral for PT. Thanks

## 2011-07-11 NOTE — Telephone Encounter (Signed)
What is the diagnosis? 

## 2011-07-14 ENCOUNTER — Ambulatory Visit: Payer: Commercial Managed Care - PPO | Admitting: Physical Therapy

## 2011-07-14 NOTE — Telephone Encounter (Signed)
Returned call back to [facility] w/ re[facility] who advised that pt wife is requesting the referral and was not able to provide any info. I asked per MD what is dx, Earnest Conroy was not sure and therefore stated that nothing will be required at this time. Closing phone note

## 2012-09-06 ENCOUNTER — Ambulatory Visit: Payer: Commercial Managed Care - PPO | Admitting: Internal Medicine

## 2012-09-08 ENCOUNTER — Other Ambulatory Visit (INDEPENDENT_AMBULATORY_CARE_PROVIDER_SITE_OTHER): Payer: Commercial Managed Care - PPO

## 2012-09-08 ENCOUNTER — Encounter: Payer: Self-pay | Admitting: Internal Medicine

## 2012-09-08 ENCOUNTER — Ambulatory Visit (INDEPENDENT_AMBULATORY_CARE_PROVIDER_SITE_OTHER)
Admission: RE | Admit: 2012-09-08 | Discharge: 2012-09-08 | Disposition: A | Discharge status: Home / Self Care | Payer: Commercial Managed Care - PPO | Source: Ambulatory Visit | Attending: Internal Medicine | Admitting: Internal Medicine

## 2012-09-08 ENCOUNTER — Ambulatory Visit (INDEPENDENT_AMBULATORY_CARE_PROVIDER_SITE_OTHER): Payer: Commercial Managed Care - PPO | Admitting: Internal Medicine

## 2012-09-08 VITALS — BP 118/60 | HR 88 | Temp 97.6°F | Resp 16 | Ht 70.0 in | Wt 219.2 lb

## 2012-09-08 DIAGNOSIS — R109 Unspecified abdominal pain: Secondary | ICD-10-CM | POA: Insufficient documentation

## 2012-09-08 DIAGNOSIS — R7309 Other abnormal glucose: Secondary | ICD-10-CM

## 2012-09-08 DIAGNOSIS — Z23 Encounter for immunization: Secondary | ICD-10-CM

## 2012-09-08 DIAGNOSIS — E119 Type 2 diabetes mellitus without complications: Secondary | ICD-10-CM | POA: Insufficient documentation

## 2012-09-08 LAB — HM DIABETES FOOT EXAM: HM Diabetic Foot Exam: NORMAL

## 2012-09-08 LAB — URINALYSIS, ROUTINE W REFLEX MICROSCOPIC
Ketones, ur: NEGATIVE
Specific Gravity, Urine: 1.025 (ref 1.000–1.030)
Total Protein, Urine: NEGATIVE
Urine Glucose: NEGATIVE
pH: 6 (ref 5.0–8.0)

## 2012-09-08 LAB — CBC WITH DIFFERENTIAL/PLATELET
Basophils Absolute: 0 10*3/uL (ref 0.0–0.1)
Basophils Relative: 0.4 % (ref 0.0–3.0)
Eosinophils Absolute: 0.3 10*3/uL (ref 0.0–0.7)
Lymphocytes Relative: 38.9 % (ref 12.0–46.0)
MCHC: 32.5 g/dL (ref 30.0–36.0)
MCV: 77.9 fl — ABNORMAL LOW (ref 78.0–100.0)
Monocytes Absolute: 0.6 10*3/uL (ref 0.1–1.0)
Neutro Abs: 3.1 10*3/uL (ref 1.4–7.7)
Neutrophils Relative %: 47.2 % (ref 43.0–77.0)
RDW: 13.4 % (ref 11.5–14.6)

## 2012-09-08 LAB — COMPREHENSIVE METABOLIC PANEL
ALT: 58 U/L — ABNORMAL HIGH (ref 0–53)
AST: 37 U/L (ref 0–37)
Albumin: 3.9 g/dL (ref 3.5–5.2)
Alkaline Phosphatase: 70 U/L (ref 39–117)
Calcium: 8.6 mg/dL (ref 8.4–10.5)
Chloride: 101 mEq/L (ref 96–112)
Potassium: 4.3 mEq/L (ref 3.5–5.1)
Sodium: 137 mEq/L (ref 135–145)

## 2012-09-08 LAB — HEMOGLOBIN A1C: Hgb A1c MFr Bld: 7.5 % — ABNORMAL HIGH (ref 4.6–6.5)

## 2012-09-08 NOTE — Progress Notes (Signed)
Subjective:    Patient ID: Marcus Schultz, male    DOB: 1976/04/23, 36 y.o.   MRN: [id number]  Flank Pain This is a recurrent problem. The current episode started more than 1 month ago. The problem occurs intermittently. The problem is unchanged. Pain location: bilaterlal posterior flank area. The quality of the pain is described as burning. The pain does not radiate. The pain is at a severity of 1/10. The pain is mild. The pain is worse during the day. Pertinent negatives include no abdominal pain, bladder incontinence, bowel incontinence, chest pain, dysuria, fever, headaches, leg pain, numbness, paresis, paresthesias, pelvic pain, perianal numbness, tingling, weakness or weight loss. Risk factors include obesity. He has tried NSAIDs for the symptoms. The treatment provided significant relief.      Review of Systems  Constitutional: Positive for unexpected weight change (some weight gain). Negative for fever, chills, weight loss, diaphoresis, activity change, appetite change and fatigue.  HENT: Negative.   Eyes: Negative.   Respiratory: Negative for cough, chest tightness, shortness of breath, wheezing and stridor.   Cardiovascular: Negative for chest pain, palpitations and leg swelling.  Gastrointestinal: Negative for nausea, vomiting, abdominal pain, diarrhea, constipation, blood in stool, abdominal distention, anal bleeding, rectal pain and bowel incontinence.  Genitourinary: Positive for flank pain. Negative for bladder incontinence, dysuria, urgency, hematuria, decreased urine volume, enuresis, testicular pain and pelvic pain.  Musculoskeletal: Negative for myalgias, back pain, joint swelling, arthralgias and gait problem.  Skin: Negative.   Neurological: Negative for dizziness, tingling, tremors, seizures, syncope, facial asymmetry, speech difficulty, weakness, light-headedness, numbness, headaches and paresthesias.  Hematological: Negative for adenopathy. Does not bruise/bleed easily.   Psychiatric/Behavioral: Negative.        Objective:   Physical Exam  Vitals reviewed. Constitutional: He is oriented to person, place, and time. Vital signs are normal. He appears well-developed and well-nourished.  Non-toxic appearance. He does not have a sickly appearance. He does not appear ill. No distress.  HENT:  Head: Normocephalic and atraumatic.  Mouth/Throat: Oropharynx is clear and moist. No oropharyngeal exudate.  Eyes: Conjunctivae normal are normal. Right eye exhibits no discharge. Left eye exhibits no discharge. No scleral icterus.  Neck: Normal range of motion. Neck supple. No JVD present. No tracheal deviation present. No thyromegaly present.  Cardiovascular: Normal rate, regular rhythm, normal heart sounds and intact distal pulses.  Exam reveals no gallop and no friction rub.   No murmur heard. Pulmonary/Chest: Effort normal and breath sounds normal. No stridor. No respiratory distress. He has no wheezes. He has no rales. He exhibits no tenderness.  Abdominal: Soft. Normal appearance and bowel sounds are normal. He exhibits no shifting dullness, no distension, no pulsatile liver, no fluid wave, no abdominal bruit, no ascites, no pulsatile midline mass and no mass. There is no hepatosplenomegaly. There is no tenderness. There is no rigidity, no rebound, no guarding, no CVA tenderness, no tenderness at McBurney's point and negative Murphy's sign. No hernia.  Musculoskeletal: Normal range of motion. He exhibits no edema and no tenderness.  Lymphadenopathy:    He has no cervical adenopathy.  Neurological: He is oriented to person, place, and time.  Skin: Skin is warm and dry. No rash noted. He is not diaphoretic. No erythema. No pallor.  Psychiatric: He has a normal mood and affect. His behavior is normal. Judgment and thought content normal.      Lab Results  Component Value Date   WBC 7.0 10/01/2010   HGB 14.5 10/01/2010   HCT 42.7 10/01/2010  PLT 270.0 10/01/2010    GLUCOSE 102* 10/01/2010   CHOL 193 10/01/2010   TRIG 119.0 10/01/2010   HDL 33.30* 10/01/2010   LDLCALC 136* 10/01/2010   ALT 41 10/01/2010   AST 30 10/01/2010   NA 138 10/01/2010   K 4.8 10/01/2010   CL 101 10/01/2010   CREATININE 0.9 10/01/2010   BUN 15 10/01/2010   CO2 30 10/01/2010   TSH 1.46 10/01/2010      Assessment & Plan:

## 2012-09-08 NOTE — Assessment & Plan Note (Signed)
I will check his A1C to see if he has developed DM II 

## 2012-09-08 NOTE — Patient Instructions (Signed)
Flank Pain  Flank pain refers to pain that is located on the side of the body between the upper abdomen and the back. It can be caused by many things.  CAUSES   Some of the more common causes of flank pain include:   Muscle strain.   Muscle spasms.   A disease of your spine (vertebral disk disease).   A lung infection (pneumonia).   Fluid around your lungs (pulmonary edema).   A kidney infection.   Kidney stones.   A very painful skin rash on only one side of your body (shingles).   Gallbladder disease.  DIAGNOSIS   Blood tests, urine tests, and X-rays may help your caregiver determine what is wrong.  TREATMENT   The treatment of pain depends on the cause. Your caregiver will determine what treatment will work best for you.  HOME CARE INSTRUCTIONS    Home care will depend on the cause of your pain.   Some medications may help relieve the pain. Take medication for relief of pain as directed by your caregiver.   Tell your caregiver about any changes in your pain.   Follow up with your caregiver.  SEEK IMMEDIATE MEDICAL CARE IF:    Your pain is not controlled with medication.   The pain increases.   You have abdominal pain.   You have shortness of breath.   You have persistent nausea or vomiting.   You have swelling in your abdomen.   You feel faint or pass out.   You have a temperature by mouth above 102 F (38.9 C), not controlled by medicine.  MAKE SURE YOU:    Understand these instructions.   Will watch your condition.   Will get help right away if you are not doing well or get worse.  Document Released: 11/13/2005 Document Revised: 12/15/2011 Document Reviewed: 03/09/2010  [employer] Patient Information 2013 [employer].

## 2012-09-08 NOTE — Assessment & Plan Note (Signed)
His pain sounds musculoskeletal He'll nsaids for pain I will check his labs today to see if there is an organic illness to explain his pain Will check a plain film of the abd to see if there are any stones

## 2012-09-13 ENCOUNTER — Encounter: Payer: Self-pay | Admitting: Internal Medicine

## 2012-09-13 ENCOUNTER — Other Ambulatory Visit (INDEPENDENT_AMBULATORY_CARE_PROVIDER_SITE_OTHER): Payer: Commercial Managed Care - PPO

## 2012-09-13 ENCOUNTER — Ambulatory Visit (INDEPENDENT_AMBULATORY_CARE_PROVIDER_SITE_OTHER): Payer: Commercial Managed Care - PPO | Admitting: Internal Medicine

## 2012-09-13 VITALS — BP 109/70 | HR 77 | Temp 97.6°F | Resp 16 | Ht 70.0 in | Wt 215.0 lb

## 2012-09-13 DIAGNOSIS — IMO0001 Reserved for inherently not codable concepts without codable children: Secondary | ICD-10-CM

## 2012-09-13 LAB — BASIC METABOLIC PANEL
BUN: 20 mg/dL (ref 6–23)
CO2: 26 mEq/L (ref 19–32)
Chloride: 104 mEq/L (ref 96–112)
GFR: 93.1 mL/min (ref >60.00)
Glucose, Bld: 101 mg/dL — ABNORMAL HIGH (ref 70–99)
Potassium: 4 mEq/L (ref 3.5–5.1)
Sodium: 137 mEq/L (ref 135–145)

## 2012-09-13 MED ORDER — SITAGLIPTIN PHOSPHATE 100 MG PO TABS: 100.0000 mg | ORAL_TABLET | Freq: Every day | ORAL | Status: DC

## 2012-09-13 NOTE — Progress Notes (Signed)
  Subjective:    Patient ID: Marcus Schultz, male    DOB: 06-22-1976, 36 y.o.   MRN: [id number]  Diabetes He presents for his initial diabetic visit. He has type 2 diabetes mellitus. The initial diagnosis of diabetes was made 1 month ago. His disease course has been worsening. There are no hypoglycemic associated symptoms. Pertinent negatives for diabetes include no blurred vision, no chest pain, no fatigue, no foot paresthesias, no foot ulcerations, no polydipsia, no polyphagia, no polyuria, no visual change, no weakness and no weight loss. There are no hypoglycemic complications. Symptoms are stable. There are no diabetic complications. When asked about current treatments, none were reported. His weight is stable. He is following a generally unhealthy diet. When asked about meal planning, he reported none. He has not had a previous visit with a dietician. He participates in exercise intermittently. There is no change in his home blood glucose trend. An ACE inhibitor/angiotensin II receptor blocker is not being taken. He does not see a podiatrist.Eye exam is not current.      Review of Systems  Constitutional: Negative.  Negative for weight loss and fatigue.  HENT: Negative.   Eyes: Negative.  Negative for blurred vision.  Respiratory: Negative.   Cardiovascular: Negative.  Negative for chest pain.  Gastrointestinal: Negative.   Genitourinary: Negative.  Negative for polyuria.  Musculoskeletal: Negative.   Skin: Negative.   Neurological: Negative.  Negative for weakness.  Hematological: Negative.  Negative for polydipsia and polyphagia.  Psychiatric/Behavioral: Negative.        Objective:   Physical Exam  Vitals reviewed. Constitutional: He is oriented to person, place, and time. He appears well-developed and well-nourished. No distress.  HENT:  Head: Normocephalic and atraumatic.  Mouth/Throat: No oropharyngeal exudate.  Eyes: Conjunctivae normal are normal. Right eye exhibits no  discharge. Left eye exhibits no discharge. No scleral icterus.  Neck: Normal range of motion. Neck supple. No JVD present. No tracheal deviation present. No thyromegaly present.  Cardiovascular: Normal rate, regular rhythm, normal heart sounds and intact distal pulses.  Exam reveals no gallop and no friction rub.   No murmur heard. Pulmonary/Chest: Effort normal and breath sounds normal. No stridor. No respiratory distress. He has no wheezes. He has no rales. He exhibits no tenderness.  Abdominal: Soft. Bowel sounds are normal. He exhibits no distension and no mass. There is no tenderness. There is no rebound and no guarding.  Musculoskeletal: Normal range of motion. He exhibits no edema and no tenderness.  Lymphadenopathy:    He has no cervical adenopathy.  Neurological: He is oriented to person, place, and time.  Skin: Skin is warm and dry. No rash noted. He is not diaphoretic. No erythema. No pallor.  Psychiatric: He has a normal mood and affect. His behavior is normal. Judgment and thought content normal.      Lab Results  Component Value Date   WBC 6.6 09/08/2012   HGB 14.2 09/08/2012   HCT 43.6 09/08/2012   PLT 271.0 09/08/2012   GLUCOSE 191* 09/08/2012   CHOL 193 10/01/2010   TRIG 119.0 10/01/2010   HDL 33.30* 10/01/2010   LDLCALC 136* 10/01/2010   ALT 58* 09/08/2012   AST 37 09/08/2012   NA 137 09/08/2012   K 4.3 09/08/2012   CL 101 09/08/2012   CREATININE 0.8 09/08/2012   BUN 14 09/08/2012   CO2 30 09/08/2012   TSH 1.46 10/01/2010   HGBA1C 7.5* 09/08/2012      Assessment & Plan:

## 2012-09-13 NOTE — Patient Instructions (Addendum)

## 2012-09-14 ENCOUNTER — Encounter: Payer: Self-pay | Admitting: Internal Medicine

## 2012-09-14 NOTE — Assessment & Plan Note (Signed)
Start Januvia Check a c-peptide today ( is he more of a type I or II? ) Refer for eye exam and nutrition/education Pt ed material was given

## 2012-09-16 ENCOUNTER — Telehealth: Payer: Self-pay

## 2012-09-16 NOTE — Telephone Encounter (Signed)
Patient called [facility] stating that he had diabetic testing that would determine which type of diabetes. Please advise on lab results Thanks

## 2012-09-16 NOTE — Telephone Encounter (Signed)
He is midway between a type I and a type II, he may need insulin soon

## 2012-09-16 NOTE — Telephone Encounter (Signed)
Patient notified per MD//[facility] to call back to set appt to start treatment

## 2012-09-20 ENCOUNTER — Ambulatory Visit (INDEPENDENT_AMBULATORY_CARE_PROVIDER_SITE_OTHER): Payer: Commercial Managed Care - PPO | Admitting: Internal Medicine

## 2012-09-20 ENCOUNTER — Encounter: Payer: Self-pay | Admitting: Internal Medicine

## 2012-09-20 VITALS — BP 104/60 | HR 73 | Temp 98.4°F | Resp 16 | Wt 212.0 lb

## 2012-09-20 DIAGNOSIS — IMO0001 Reserved for inherently not codable concepts without codable children: Secondary | ICD-10-CM

## 2012-09-20 NOTE — Assessment & Plan Note (Signed)
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 For now he will stay on januvia and I will monitor him for the need to start insulin.

## 2012-09-20 NOTE — Progress Notes (Signed)
Subjective:    Patient ID: Marcus Schultz, male    DOB: [birth year], 36 y.o.   MRN: [id number]  Diabetes He presents for his follow-up diabetic visit. He has type 2 diabetes mellitus. His disease course has been improving. There are no hypoglycemic associated symptoms. Pertinent negatives for diabetes include no blurred vision, no chest pain, no fatigue, no foot paresthesias, no foot ulcerations, no polydipsia, no polyphagia, no polyuria, no visual change, no weakness and no weight loss. There are no hypoglycemic complications. There are no diabetic complications. Current diabetic treatment includes oral agent (monotherapy). He is compliant with treatment all of the time. His weight is decreasing steadily. He is following a generally healthy diet. Meal planning includes avoidance of concentrated sweets. He has not had a previous visit with a dietician. He participates in exercise three times a week. His home blood glucose trend is decreasing steadily. His breakfast blood glucose range is generally 70-90 mg/dl. His lunch blood glucose range is generally 90-110 mg/dl. His dinner blood glucose range is generally 90-110 mg/dl. His highest blood glucose is 90-110 mg/dl. His overall blood glucose range is 90-110 mg/dl. An ACE inhibitor/angiotensin II receptor blocker is contraindicated. He does not see a podiatrist.Eye exam is not current.      Review of Systems  Constitutional: Negative.  Negative for weight loss and fatigue.  HENT: Negative.   Eyes: Negative.  Negative for blurred vision.  Respiratory: Negative.   Cardiovascular: Negative.  Negative for chest pain.  Gastrointestinal: Negative.   Genitourinary: Negative.  Negative for polyuria.  Musculoskeletal: Negative.   Skin: Negative.   Neurological: Negative.  Negative for weakness.  Hematological: Negative.  Negative for polydipsia and polyphagia.  Psychiatric/Behavioral: Negative.        Objective:   Physical Exam  Vitals  reviewed. Constitutional: He is oriented to person, place, and time. He appears well-developed and well-nourished. No distress.  HENT:  Head: Normocephalic and atraumatic.  Mouth/Throat: Oropharynx is clear and moist. No oropharyngeal exudate.  Eyes: Conjunctivae normal are normal. Right eye exhibits no discharge. Left eye exhibits no discharge. No scleral icterus.  Neck: Normal range of motion. Neck supple. No JVD present. No tracheal deviation present. No thyromegaly present.  Cardiovascular: Normal rate, regular rhythm, normal heart sounds and intact distal pulses.  Exam reveals no gallop and no friction rub.   No murmur heard. Pulmonary/Chest: Effort normal and breath sounds normal. No stridor. No respiratory distress. He has no wheezes. He has no rales. He exhibits no tenderness.  Abdominal: Soft. Bowel sounds are normal. He exhibits no distension and no mass. There is no tenderness. There is no rebound and no guarding.  Musculoskeletal: Normal range of motion. He exhibits no edema and no tenderness.  Lymphadenopathy:    He has no cervical adenopathy.  Neurological: He is oriented to person, place, and time.  Skin: Skin is warm and dry. No rash noted. He is not diaphoretic. No erythema. No pallor.  Psychiatric: He has a normal mood and affect. His behavior is normal. Judgment and thought content normal.      Lab Results  Component Value Date   WBC 6.6 09/08/2012   HGB 14.2 09/08/2012   HCT 43.6 09/08/2012   PLT 271.0 09/08/2012   GLUCOSE 101* 09/13/2012   CHOL 193 10/01/2010   TRIG 119.0 10/01/2010   HDL 33.30* 10/01/2010   LDLCALC 136* 10/01/2010   ALT 58* 09/08/2012   AST 37 09/08/2012   NA 137 09/13/2012   K 4.0 09/13/2012  CL 104 09/13/2012   CREATININE 1.0 09/13/2012   BUN 20 09/13/2012   CO2 26 09/13/2012   TSH 1.46 10/01/2010   HGBA1C 7.5* 09/08/2012      Assessment & Plan:

## 2012-09-23 ENCOUNTER — Encounter: Payer: Commercial Managed Care - PPO | Attending: Internal Medicine | Admitting: *Deleted

## 2012-09-23 ENCOUNTER — Encounter: Payer: Self-pay | Admitting: *Deleted

## 2012-09-23 VITALS — Ht 70.0 in | Wt 210.9 lb

## 2012-09-23 DIAGNOSIS — IMO0001 Reserved for inherently not codable concepts without codable children: Secondary | ICD-10-CM

## 2012-09-23 DIAGNOSIS — Z713 Dietary counseling and surveillance: Secondary | ICD-10-CM | POA: Insufficient documentation

## 2012-09-23 DIAGNOSIS — E119 Type 2 diabetes mellitus without complications: Secondary | ICD-10-CM | POA: Insufficient documentation

## 2012-09-23 NOTE — Patient Instructions (Signed)
Goals:  Follow Diabetes Meal Plan as instructed  Eat 3 meals and 2 snacks, every 3-5 hrs  Limit carbohydrate intake to 45-60 grams carbohydrate/meal  Limit carbohydrate intake to 30 grams carbohydrate/snack  Add lean protein foods to meals/snacks  Monitor glucose levels as instructed by your doctor  Aim for 30 mins of physical activity daily  Bring food record and glucose log to your next nutrition visit   

## 2012-09-23 NOTE — Progress Notes (Signed)
HbA1C 7.5%   Patient was seen on 09/23/12 for the first of a series of three diabetes self-management courses at the [facility]. The following learning objectives were met by the patient during this course:   Defines the role of glucose and insulin  Identifies type of diabetes and pathophysiology  Defines the diagnostic criteria for diabetes and prediabetes  States the risk factors for Type 2 Diabetes  States the symptoms of Type 2 Diabetes  Defines Type 2 Diabetes treatment goals  Defines Type 2 Diabetes treatment options  States the rationale for glucose monitoring  Identifies A1C, glucose targets, and testing times  Identifies proper sharps disposal  Defines the purpose of a diabetes food plan  Identifies carbohydrate food groups  Defines effects of carbohydrate foods on glucose levels  Identifies carbohydrate choices/grams/food labels  States benefits of physical activity and effect on glucose  Review of suggested activity guidelines  Handouts given during class include:  Type 2 Diabetes: Basics Book  My Food Plan Book   Follow-Up Plan: Attend core and core 3

## 2012-10-04 ENCOUNTER — Other Ambulatory Visit: Payer: Self-pay | Admitting: Internal Medicine

## 2012-10-04 DIAGNOSIS — IMO0001 Reserved for inherently not codable concepts without codable children: Secondary | ICD-10-CM

## 2012-10-05 ENCOUNTER — Other Ambulatory Visit: Payer: Self-pay | Admitting: *Deleted

## 2012-10-05 MED ORDER — SITAGLIPTIN PHOSPHATE 100 MG PO TABS: 100.0000 mg | ORAL_TABLET | Freq: Every day | ORAL | Status: DC

## 2012-10-05 NOTE — Telephone Encounter (Signed)
Refill on [place] sent to [facility].

## 2012-12-02 ENCOUNTER — Other Ambulatory Visit (INDEPENDENT_AMBULATORY_CARE_PROVIDER_SITE_OTHER): Payer: No Typology Code available for payment source

## 2012-12-02 ENCOUNTER — Encounter: Payer: Self-pay | Admitting: Internal Medicine

## 2012-12-02 ENCOUNTER — Ambulatory Visit (INDEPENDENT_AMBULATORY_CARE_PROVIDER_SITE_OTHER): Payer: No Typology Code available for payment source | Admitting: Internal Medicine

## 2012-12-02 VITALS — BP 130/80 | HR 66 | Temp 97.5°F | Resp 16 | Wt 198.5 lb

## 2012-12-02 DIAGNOSIS — R5381 Other malaise: Secondary | ICD-10-CM

## 2012-12-02 DIAGNOSIS — IMO0001 Reserved for inherently not codable concepts without codable children: Secondary | ICD-10-CM

## 2012-12-02 DIAGNOSIS — E291 Testicular hypofunction: Secondary | ICD-10-CM | POA: Insufficient documentation

## 2012-12-02 DIAGNOSIS — R5383 Other fatigue: Secondary | ICD-10-CM

## 2012-12-02 LAB — URINALYSIS, ROUTINE W REFLEX MICROSCOPIC
Bilirubin Urine: NEGATIVE
Hgb urine dipstick: NEGATIVE
Ketones, ur: NEGATIVE
Leukocytes, UA: NEGATIVE
Specific Gravity, Urine: 1.02 (ref 1.000–1.030)
Urine Glucose: NEGATIVE
Urobilinogen, UA: 0.2 (ref 0.0–1.0)

## 2012-12-02 LAB — CBC WITH DIFFERENTIAL/PLATELET
Eosinophils Relative: 2.9 % (ref 0.0–5.0)
HCT: 41.8 % (ref 39.0–52.0)
Hemoglobin: 13.9 g/dL (ref 13.0–17.0)
Lymphs Abs: 2.1 10*3/uL (ref 0.7–4.0)
MCV: 76.5 fl — ABNORMAL LOW (ref 78.0–100.0)
Monocytes Absolute: 0.6 10*3/uL (ref 0.1–1.0)
Monocytes Relative: 8.8 % (ref 3.0–12.0)
Neutro Abs: 4.3 10*3/uL (ref 1.4–7.7)
Platelets: 262 10*3/uL (ref 150.0–400.0)
WBC: 7.3 10*3/uL (ref 4.5–10.5)

## 2012-12-02 LAB — COMPREHENSIVE METABOLIC PANEL
Alkaline Phosphatase: 63 U/L (ref 39–117)
BUN: 18 mg/dL (ref 6–23)
CO2: 28 mEq/L (ref 19–32)
Creatinine, Ser: 0.9 mg/dL (ref 0.4–1.5)
GFR: 98.85 mL/min (ref >60.00)
Glucose, Bld: 99 mg/dL (ref 70–99)
Total Bilirubin: 0.5 mg/dL (ref 0.3–1.2)

## 2012-12-02 LAB — TSH: TSH: 1.41 u[IU]/mL (ref 0.35–5.50)

## 2012-12-02 LAB — LIPID PANEL
HDL: 31.2 mg/dL — ABNORMAL LOW (ref >39.00)
LDL Cholesterol: 103 mg/dL — ABNORMAL HIGH (ref 0–99)
Total CHOL/HDL Ratio: 5
VLDL: 18.8 mg/dL (ref 0.0–40.0)

## 2012-12-02 NOTE — Progress Notes (Signed)
Subjective:    Patient ID: Marcus Schultz, male    DOB: May 30, 1976, 37 y.o.   MRN: [id number]  Diabetes He presents for his follow-up diabetic visit. He has type 2 diabetes mellitus. His disease course has been stable. There are no hypoglycemic associated symptoms. Associated symptoms include fatigue, polyuria and weight loss. Pertinent negatives for diabetes include no blurred vision, no chest pain, no foot paresthesias, no foot ulcerations, no polydipsia, no polyphagia, no visual change and no weakness. There are no hypoglycemic complications. Symptoms are stable. There are no diabetic complications. Current diabetic treatment includes oral agent (monotherapy). He is compliant with treatment all of the time. His weight is decreasing steadily. He is following a generally healthy diet. Meal planning includes avoidance of concentrated sweets. He has not had a previous visit with a dietician. He participates in exercise three times a week. His home blood glucose trend is decreasing steadily. His breakfast blood glucose range is generally 90-110 mg/dl. His lunch blood glucose range is generally 110-130 mg/dl. His dinner blood glucose range is generally 110-130 mg/dl. His highest blood glucose is 130-140 mg/dl. His overall blood glucose range is 90-110 mg/dl. An ACE inhibitor/angiotensin II receptor blocker is not being taken. He does not see a podiatrist.Eye exam is current.      Review of Systems  Constitutional: Positive for weight loss, fatigue and unexpected weight change. Negative for fever, chills, diaphoresis, activity change and appetite change.  HENT: Negative.   Eyes: Negative.  Negative for blurred vision.  Respiratory: Negative.  Negative for cough and shortness of breath.   Cardiovascular: Negative.  Negative for chest pain.  Gastrointestinal: Negative for nausea, vomiting, abdominal pain, diarrhea, constipation, blood in stool, abdominal distention, anal bleeding and rectal pain.   Endocrine: Positive for polyuria. Negative for polydipsia and polyphagia.  Musculoskeletal: Negative.   Skin: Negative.   Allergic/Immunologic: Negative.   Neurological: Negative.  Negative for weakness.  Hematological: Negative for adenopathy. Does not bruise/bleed easily.  Psychiatric/Behavioral: Negative.        Objective:   Physical Exam  Vitals reviewed. Constitutional: He is oriented to person, place, and time. He appears well-developed and well-nourished. No distress.  HENT:  Head: Normocephalic and atraumatic.  Mouth/Throat: Oropharynx is clear and moist. No oropharyngeal exudate.  Eyes: Conjunctivae are normal. Right eye exhibits no discharge. Left eye exhibits no discharge. No scleral icterus.  Neck: Normal range of motion. Neck supple. No JVD present. No tracheal deviation present. No thyromegaly present.  Cardiovascular: Normal rate, regular rhythm, normal heart sounds and intact distal pulses.  Exam reveals no gallop and no friction rub.   No murmur heard. Pulmonary/Chest: Effort normal and breath sounds normal. No stridor. No respiratory distress. He has no wheezes. He has no rales. He exhibits no tenderness.  Abdominal: Soft. Bowel sounds are normal. He exhibits no distension and no mass. There is no tenderness. There is no rebound and no guarding.  Musculoskeletal: Normal range of motion. He exhibits no edema and no tenderness.  Lymphadenopathy:    He has no cervical adenopathy.  Neurological: He is oriented to person, place, and time.  Skin: Skin is warm and dry. No rash noted. He is not diaphoretic. No erythema. No pallor.  Psychiatric: He has a normal mood and affect. His behavior is normal. Judgment and thought content normal.     Lab Results  Component Value Date   WBC 7.3 12/02/2012   HGB 13.9 12/02/2012   HCT 41.8 12/02/2012   PLT 262.0 12/02/2012  GLUCOSE 99 12/02/2012   CHOL 153 12/02/2012   TRIG 94.0 12/02/2012   HDL 31.20* 12/02/2012   LDLCALC 103*  12/02/2012   ALT 25 12/02/2012   AST 20 12/02/2012   NA 138 12/02/2012   K 3.5 12/02/2012   CL 104 12/02/2012   CREATININE 0.9 12/02/2012   BUN 18 12/02/2012   CO2 28 12/02/2012   TSH 1.41 12/02/2012   HGBA1C 6.4 12/02/2012       Assessment & Plan:

## 2012-12-02 NOTE — Patient Instructions (Signed)

## 2012-12-02 NOTE — Assessment & Plan Note (Signed)
His a1c is in the normal range

## 2012-12-02 NOTE — Assessment & Plan Note (Signed)
I will check his labs to look for secondary causes (hypogonadism, hypothyroidism, anemia, renal, etc)

## 2012-12-03 ENCOUNTER — Encounter: Payer: Self-pay | Admitting: Internal Medicine

## 2012-12-03 LAB — TESTOSTERONE, FREE, TOTAL, SHBG
Testosterone-% Free: 2.6 % (ref 1.6–2.9)
Testosterone: 188 ng/dL — ABNORMAL LOW (ref 300–890)

## 2012-12-04 ENCOUNTER — Encounter: Payer: Self-pay | Admitting: Internal Medicine

## 2012-12-06 ENCOUNTER — Other Ambulatory Visit: Payer: Self-pay | Admitting: Internal Medicine

## 2012-12-06 DIAGNOSIS — E291 Testicular hypofunction: Secondary | ICD-10-CM

## 2012-12-06 MED ORDER — TESTOSTERONE 10 MG/ACT (2%) TD GEL: 4.0000 | Freq: Every day | TRANSDERMAL | Status: DC

## 2012-12-07 ENCOUNTER — Encounter: Payer: Self-pay | Admitting: Internal Medicine

## 2012-12-07 DIAGNOSIS — IMO0001 Reserved for inherently not codable concepts without codable children: Secondary | ICD-10-CM

## 2012-12-07 MED ORDER — SITAGLIPTIN PHOSPHATE 100 MG PO TABS: 100.0000 mg | ORAL_TABLET | Freq: Every day | ORAL | Status: DC

## 2012-12-08 ENCOUNTER — Other Ambulatory Visit: Payer: Self-pay

## 2012-12-08 DIAGNOSIS — IMO0001 Reserved for inherently not codable concepts without codable children: Secondary | ICD-10-CM

## 2012-12-08 MED ORDER — SITAGLIPTIN PHOSPHATE 100 MG PO TABS: 100.0000 mg | ORAL_TABLET | Freq: Every day | ORAL | Status: DC

## 2013-01-04 ENCOUNTER — Other Ambulatory Visit: Payer: Self-pay

## 2013-01-04 DIAGNOSIS — IMO0001 Reserved for inherently not codable concepts without codable children: Secondary | ICD-10-CM

## 2013-01-04 MED ORDER — SITAGLIPTIN PHOSPHATE 100 MG PO TABS: 100.0000 mg | ORAL_TABLET | Freq: Every day | ORAL | Status: DC

## 2013-01-17 ENCOUNTER — Encounter: Payer: Self-pay | Admitting: Internal Medicine

## 2013-01-17 ENCOUNTER — Ambulatory Visit (INDEPENDENT_AMBULATORY_CARE_PROVIDER_SITE_OTHER): Payer: No Typology Code available for payment source | Admitting: Internal Medicine

## 2013-01-17 ENCOUNTER — Ambulatory Visit (INDEPENDENT_AMBULATORY_CARE_PROVIDER_SITE_OTHER)
Admission: RE | Admit: 2013-01-17 | Discharge: 2013-01-17 | Disposition: A | Discharge status: Home / Self Care | Payer: No Typology Code available for payment source | Source: Ambulatory Visit | Attending: Internal Medicine | Admitting: Internal Medicine

## 2013-01-17 VITALS — BP 112/74 | HR 76 | Temp 97.5°F | Resp 16 | Wt 198.8 lb

## 2013-01-17 DIAGNOSIS — E291 Testicular hypofunction: Secondary | ICD-10-CM

## 2013-01-17 DIAGNOSIS — R05 Cough: Secondary | ICD-10-CM

## 2013-01-17 DIAGNOSIS — J209 Acute bronchitis, unspecified: Secondary | ICD-10-CM

## 2013-01-17 DIAGNOSIS — R059 Cough, unspecified: Secondary | ICD-10-CM

## 2013-01-17 MED ORDER — HYDROCOD POLST-CPM POLST ER 10-8 MG PO CP12: 1.0000 | ORAL_CAPSULE | Freq: Two times a day (BID) | ORAL | Status: DC | PRN

## 2013-01-17 MED ORDER — AZITHROMYCIN 500 MG PO TABS: 500.0000 mg | ORAL_TABLET | Freq: Every day | ORAL | Status: DC

## 2013-01-17 NOTE — Assessment & Plan Note (Signed)
Chest xray is neg for pna, mass, edema

## 2013-01-17 NOTE — Patient Instructions (Signed)

## 2013-01-17 NOTE — Progress Notes (Signed)
Subjective:    Patient ID: Marcus Schultz, male    DOB: [birth year], 37 y.o.   MRN: [id number]  Cough This is a new problem. The current episode started in the past 7 days. The problem has been unchanged. The problem occurs every few hours. The cough is productive of purulent sputum. Associated symptoms include chills, a fever and a sore throat. Pertinent negatives include no chest pain, ear congestion, ear pain, headaches, heartburn, hemoptysis, myalgias, nasal congestion, postnasal drip, rash, rhinorrhea, shortness of breath, sweats, weight loss or wheezing. He has tried OTC cough suppressant for the symptoms. The treatment provided moderate relief. There is no history of asthma, COPD, emphysema or pneumonia.      Review of Systems  Constitutional: Positive for fever and chills. Negative for weight loss, diaphoresis, activity change, appetite change, fatigue and unexpected weight change.  HENT: Positive for sore throat. Negative for ear pain, rhinorrhea, trouble swallowing, voice change, postnasal drip and sinus pressure.   Eyes: Negative.   Respiratory: Positive for cough. Negative for apnea, hemoptysis, choking, chest tightness, shortness of breath, wheezing and stridor.   Cardiovascular: Negative.  Negative for chest pain, palpitations and leg swelling.  Gastrointestinal: Negative.  Negative for heartburn, nausea, vomiting, abdominal pain, diarrhea and constipation.  Endocrine: Negative.   Genitourinary: Negative.   Musculoskeletal: Negative.  Negative for myalgias, back pain, joint swelling, arthralgias and gait problem.  Skin: Negative for color change, pallor, rash and wound.  Allergic/Immunologic: Negative.   Neurological: Negative for dizziness, tremors, seizures, syncope, facial asymmetry, speech difficulty, weakness, light-headedness, numbness and headaches.  Hematological: Negative.   Psychiatric/Behavioral: Negative.        Objective:   Physical Exam  Vitals  reviewed. Constitutional: He is oriented to person, place, and time. He appears well-developed and well-nourished.  Non-toxic appearance. He does not have a sickly appearance. He does not appear ill. No distress.  HENT:  Head: Normocephalic and atraumatic.  Mouth/Throat: Oropharynx is clear and moist. No oropharyngeal exudate.  Eyes: Conjunctivae are normal. Right eye exhibits no discharge. Left eye exhibits no discharge. No scleral icterus.  Neck: Normal range of motion. Neck supple. No JVD present. No tracheal deviation present. No thyromegaly present.  Cardiovascular: Normal rate, regular rhythm, normal heart sounds and intact distal pulses.  Exam reveals no gallop and no friction rub.   No murmur heard. Pulmonary/Chest: Effort normal and breath sounds normal. No accessory muscle usage or stridor. Not tachypneic. No respiratory distress. He has no decreased breath sounds. He has no wheezes. He has no rhonchi. He has no rales. He exhibits no tenderness.  Abdominal: Soft. Bowel sounds are normal. He exhibits no distension and no mass. There is no tenderness. There is no rebound and no guarding.  Musculoskeletal: Normal range of motion. He exhibits no edema and no tenderness.  Lymphadenopathy:    He has no cervical adenopathy.  Neurological: He is oriented to person, place, and time.  Skin: Skin is warm and dry. No rash noted. He is not diaphoretic. No erythema. No pallor.  Psychiatric: He has a normal mood and affect. His behavior is normal. Judgment and thought content normal.     Lab Results  Component Value Date   WBC 7.3 12/02/2012   HGB 13.9 12/02/2012   HCT 41.8 12/02/2012   PLT 262.0 12/02/2012   GLUCOSE 99 12/02/2012   CHOL 153 12/02/2012   TRIG 94.0 12/02/2012   HDL 31.20* 12/02/2012   LDLCALC 103* 12/02/2012   ALT 25 12/02/2012   AST  20 12/02/2012   NA 138 12/02/2012   K 3.5 12/02/2012   CL 104 12/02/2012   CREATININE 0.9 12/02/2012   BUN 18 12/02/2012   CO2 28 12/02/2012   TSH 1.41  12/02/2012   HGBA1C 6.4 12/02/2012       Assessment & Plan:

## 2013-01-17 NOTE — Assessment & Plan Note (Signed)
Will treat the infection with a zpak and control the cough with a suppressant

## 2013-01-28 ENCOUNTER — Encounter: Payer: Self-pay | Admitting: Internal Medicine

## 2013-02-01 ENCOUNTER — Encounter: Payer: Self-pay | Admitting: Internal Medicine

## 2013-02-14 ENCOUNTER — Telehealth: Payer: Self-pay

## 2013-02-14 DIAGNOSIS — IMO0001 Reserved for inherently not codable concepts without codable children: Secondary | ICD-10-CM

## 2013-02-14 MED ORDER — SAXAGLIPTIN HCL 5 MG PO TABS: 5.0000 mg | ORAL_TABLET | Freq: Every day | ORAL | Status: DC

## 2013-02-14 NOTE — Telephone Encounter (Signed)
Received fax from insurance stating that [place] denied due to no documented intolerance to max dose of metformin (1500mg ) daily and step therapy of Tradjenta/Jentadeuto or Onglyza/Kombiglize.

## 2013-02-14 NOTE — Telephone Encounter (Signed)
Changed to onglyza

## 2013-02-17 NOTE — Telephone Encounter (Signed)
Notified pt of med change. [facility]...lmb

## 2013-07-13 LAB — HM DIABETES EYE EXAM

## 2013-08-11 ENCOUNTER — Other Ambulatory Visit: Payer: Self-pay

## 2013-10-13 ENCOUNTER — Ambulatory Visit (INDEPENDENT_AMBULATORY_CARE_PROVIDER_SITE_OTHER): Payer: BC Managed Care – PPO | Admitting: Internal Medicine

## 2013-10-13 ENCOUNTER — Other Ambulatory Visit (INDEPENDENT_AMBULATORY_CARE_PROVIDER_SITE_OTHER): Payer: BC Managed Care – PPO

## 2013-10-13 ENCOUNTER — Encounter: Payer: Self-pay | Admitting: Internal Medicine

## 2013-10-13 VITALS — BP 116/82 | HR 72 | Temp 98.0°F | Resp 16 | Ht 70.0 in | Wt 203.0 lb

## 2013-10-13 DIAGNOSIS — E1165 Type 2 diabetes mellitus with hyperglycemia: Principal | ICD-10-CM

## 2013-10-13 DIAGNOSIS — IMO0001 Reserved for inherently not codable concepts without codable children: Secondary | ICD-10-CM

## 2013-10-13 DIAGNOSIS — E291 Testicular hypofunction: Secondary | ICD-10-CM

## 2013-10-13 LAB — CBC WITH DIFFERENTIAL/PLATELET
BASOS PCT: 0.3 % (ref 0.0–3.0)
Basophils Absolute: 0 10*3/uL (ref 0.0–0.1)
EOS ABS: 0.3 10*3/uL (ref 0.0–0.7)
Eosinophils Relative: 3 % (ref 0.0–5.0)
HEMATOCRIT: 44.9 % (ref 39.0–52.0)
HEMOGLOBIN: 14.9 g/dL (ref 13.0–17.0)
LYMPHS ABS: 3 10*3/uL (ref 0.7–4.0)
Lymphocytes Relative: 33.9 % (ref 12.0–46.0)
MCHC: 33.3 g/dL (ref 30.0–36.0)
MCV: 76.9 fl — AB (ref 78.0–100.0)
MONO ABS: 0.6 10*3/uL (ref 0.1–1.0)
Monocytes Relative: 6.7 % (ref 3.0–12.0)
NEUTROS ABS: 5 10*3/uL (ref 1.4–7.7)
Neutrophils Relative %: 56.1 % (ref 43.0–77.0)
Platelets: 333 10*3/uL (ref 150.0–400.0)
RBC: 5.84 Mil/uL — AB (ref 4.22–5.81)
RDW: 13.3 % (ref 11.5–14.6)
WBC: 8.9 10*3/uL (ref 4.5–10.5)

## 2013-10-13 LAB — COMPREHENSIVE METABOLIC PANEL
ALT: 37 U/L (ref 0–53)
AST: 21 U/L (ref 0–37)
Albumin: 4.3 g/dL (ref 3.5–5.2)
Alkaline Phosphatase: 70 U/L (ref 39–117)
BILIRUBIN TOTAL: 0.5 mg/dL (ref 0.3–1.2)
BUN: 15 mg/dL (ref 6–23)
CALCIUM: 9.4 mg/dL (ref 8.4–10.5)
CHLORIDE: 104 meq/L (ref 96–112)
CO2: 33 mEq/L — ABNORMAL HIGH (ref 19–32)
CREATININE: 0.9 mg/dL (ref 0.4–1.5)
GFR: 106.34 mL/min (ref >60.00)
Glucose, Bld: 96 mg/dL (ref 70–99)
Potassium: 4.2 mEq/L (ref 3.5–5.1)
Sodium: 141 mEq/L (ref 135–145)
Total Protein: 7.3 g/dL (ref 6.0–8.3)

## 2013-10-13 LAB — PSA: PSA: 0.91 ng/mL (ref 0.10–4.00)

## 2013-10-13 LAB — LDL CHOLESTEROL, DIRECT: Direct LDL: 136.1 mg/dL

## 2013-10-13 LAB — URINALYSIS, ROUTINE W REFLEX MICROSCOPIC
BILIRUBIN URINE: NEGATIVE
HGB URINE DIPSTICK: NEGATIVE
Ketones, ur: NEGATIVE
LEUKOCYTES UA: NEGATIVE
NITRITE: NEGATIVE
PH: 6 (ref 5.0–8.0)
Specific Gravity, Urine: <=1.005 — AB (ref 1.000–1.030)
Total Protein, Urine: NEGATIVE
Urine Glucose: NEGATIVE
Urobilinogen, UA: 0.2 (ref 0.0–1.0)

## 2013-10-13 LAB — LIPID PANEL
CHOLESTEROL: 201 mg/dL — AB (ref 0–200)
HDL: 32.3 mg/dL — ABNORMAL LOW (ref >39.00)
Total CHOL/HDL Ratio: 6
Triglycerides: 261 mg/dL — ABNORMAL HIGH (ref 0.0–149.0)
VLDL: 52.2 mg/dL — ABNORMAL HIGH (ref 0.0–40.0)

## 2013-10-13 LAB — FECAL OCCULT BLOOD, GUAIAC: FECAL OCCULT BLD: NEGATIVE

## 2013-10-13 LAB — TSH: TSH: 1 u[IU]/mL (ref 0.35–5.50)

## 2013-10-13 LAB — HEMOGLOBIN A1C: Hgb A1c MFr Bld: 6.4 % (ref 4.6–6.5)

## 2013-10-13 MED ORDER — TESTOSTERONE CYPIONATE 200 MG/ML IM SOLN: 200.0000 mg | INTRAMUSCULAR | Status: DC

## 2013-10-13 MED ORDER — SAXAGLIPTIN HCL 5 MG PO TABS: 5.0000 mg | ORAL_TABLET | Freq: Every day | ORAL | Status: DC

## 2013-10-13 NOTE — Patient Instructions (Signed)
Type 2 Diabetes Mellitus, Adult Type 2 diabetes mellitus, often simply referred to as type 2 diabetes, is a long-lasting (chronic) disease. In type 2 diabetes, the pancreas does not make enough insulin (a hormone), the cells are less responsive to the insulin that is made (insulin resistance), or both. Normally, insulin moves sugars from food into the tissue cells. The tissue cells use the sugars for energy. The lack of insulin or the lack of normal response to insulin causes excess sugars to build up in the blood instead of going into the tissue cells. As a result, high blood sugar (hyperglycemia) develops. The effect of high sugar (glucose) levels can cause many complications. Type 2 diabetes was also previously called adult-onset diabetes but it can occur at any age.  RISK FACTORS  A person is predisposed to developing type 2 diabetes if someone in the family has the disease and also has one or more of the following primary risk factors:  Overweight.  An inactive lifestyle.  A history of consistently eating high-calorie foods. Maintaining a normal weight and regular physical activity can reduce the chance of developing type 2 diabetes. SYMPTOMS  A person with type 2 diabetes may not show symptoms initially. The symptoms of type 2 diabetes appear slowly. The symptoms include:  Increased thirst (polydipsia).  Increased urination (polyuria).  Increased urination during the night (nocturia).  Weight loss. This weight loss may be rapid.  Frequent, recurring infections.  Tiredness (fatigue).  Weakness.  Vision changes, such as blurred vision.  Fruity smell to your breath.  Abdominal pain.  Nausea or vomiting.  Cuts or bruises which are slow to heal.  Tingling or numbness in the hands or feet. DIAGNOSIS Type 2 diabetes is frequently not diagnosed until complications of diabetes are present. Type 2 diabetes is diagnosed when symptoms or complications are present and when blood  glucose levels are increased. Your blood glucose level may be checked by one or more of the following blood tests:  A fasting blood glucose test. You will not be allowed to eat for at least 8 hours before a blood sample is taken.  A random blood glucose test. Your blood glucose is checked at any time of the day regardless of when you ate.  A hemoglobin A1c blood glucose test. A hemoglobin A1c test provides information about blood glucose control over the previous 3 months.  An oral glucose tolerance test (OGTT). Your blood glucose is measured after you have not eaten (fasted) for 2 hours and then after you drink a glucose-containing beverage. TREATMENT   You may need to take insulin or diabetes medicine daily to keep blood glucose levels in the desired range.  You will need to match insulin dosing with exercise and healthy food choices. The treatment goal is to maintain the before meal blood sugar (preprandial glucose) level at 70 130 mg/dL. HOME CARE INSTRUCTIONS   Have your hemoglobin A1c level checked twice a year.  Perform daily blood glucose monitoring as directed by your caregiver.  Monitor urine ketones when you are ill and as directed by your caregiver.  Take your diabetes medicine or insulin as directed by your caregiver to maintain your blood glucose levels in the desired range.  Never run out of diabetes medicine or insulin. It is needed every day.  Adjust insulin based on your intake of carbohydrates. Carbohydrates can raise blood glucose levels but need to be included in your diet. Carbohydrates provide vitamins, minerals, and fiber which are an essential part of   a healthy diet. Carbohydrates are found in fruits, vegetables, whole grains, dairy products, legumes, and foods containing added sugars.    Eat healthy foods. Alternate 3 meals with 3 snacks.  Lose weight if overweight.  Carry a medical alert card or wear your medical alert jewelry.  Carry a 15 gram  carbohydrate snack with you at all times to treat low blood glucose (hypoglycemia). Some examples of 15 gram carbohydrate snacks include:  Glucose tablets, 3 or 4   Glucose gel, 15 gram tube  Raisins, 2 tablespoons (24 grams)  Jelly beans, 6  Animal crackers, 8  Regular pop, 4 ounces (120 mL)  Gummy treats, 9  Recognize hypoglycemia. Hypoglycemia occurs with blood glucose levels of 70 mg/dL and below. The risk for hypoglycemia increases when fasting or skipping meals, during or after intense exercise, and during sleep. Hypoglycemia symptoms can include:  Tremors or shakes.  Decreased ability to concentrate.  Sweating.  Increased heart rate.  Headache.  Dry mouth.  Hunger.  Irritability.  Anxiety.  Restless sleep.  Altered speech or coordination.  Confusion.  Treat hypoglycemia promptly. If you are alert and able to safely swallow, follow the 15:15 rule:  Take 15 20 grams of rapid-acting glucose or carbohydrate. Rapid-acting options include glucose gel, glucose tablets, or 4 ounces (120 mL) of fruit juice, regular soda, or low fat milk.  Check your blood glucose level 15 minutes after taking the glucose.  Take 15 20 grams more of glucose if the repeat blood glucose level is still 70 mg/dL or below.  Eat a meal or snack within 1 hour once blood glucose levels return to normal.    Be alert to polyuria and polydipsia which are early signs of hyperglycemia. An early awareness of hyperglycemia allows for prompt treatment. Treat hyperglycemia as directed by your caregiver.  Engage in at least 150 minutes of moderate-intensity physical activity a week, spread over at least 3 days of the week or as directed by your caregiver. In addition, you should engage in resistance exercise at least 2 times a week or as directed by your caregiver.  Adjust your medicine and food intake as needed if you start a new exercise or sport.  Follow your sick day plan at any time you  are unable to eat or drink as usual.  Avoid tobacco use.  Limit alcohol intake to no more than 1 drink per day for nonpregnant women and 2 drinks per day for men. You should drink alcohol only when you are also eating food. Talk with your caregiver whether alcohol is safe for you. Tell your caregiver if you drink alcohol several times a week.  Follow up with your caregiver regularly.  Schedule an eye exam soon after the diagnosis of type 2 diabetes and then annually.  Perform daily skin and foot care. Examine your skin and feet daily for cuts, bruises, redness, nail problems, bleeding, blisters, or sores. A foot exam by a caregiver should be done annually.  Brush your teeth and gums at least twice a day and floss at least once a day. Follow up with your dentist regularly.  Share your diabetes management plan with your workplace or school.  Stay up-to-date with immunizations.  Learn to manage stress.  Obtain ongoing diabetes education and support as needed.  Participate in, or seek rehabilitation as needed to maintain or improve independence and quality of life. Request a physical or occupational therapy referral if you are having foot or hand numbness or difficulties with grooming,   dressing, eating, or physical activity. SEEK MEDICAL CARE IF:   You are unable to eat food or drink fluids for more than 6 hours.  You have nausea and vomiting for more than 6 hours.  Your blood glucose level is over 240 mg/dL.  There is a change in mental status.  You develop an additional serious illness.  You have diarrhea for more than 6 hours.  You have been sick or have had a fever for a couple of days and are not getting better.  You have pain during any physical activity.  SEEK IMMEDIATE MEDICAL CARE IF:  You have difficulty breathing.  You have moderate to large ketone levels. MAKE SURE YOU:  Understand these instructions.  Will watch your condition.  Will get help right away if  you are not doing well or get worse. Document Released: 09/22/2005 Document Revised: 06/16/2012 Document Reviewed: 04/20/2012 [employer] Patient Information 2014 [employer].  

## 2013-10-13 NOTE — Progress Notes (Signed)
Pre visit review using our clinic review tool, if applicable. No additional management support is needed unless otherwise documented below in the visit note. 

## 2013-10-13 NOTE — Progress Notes (Signed)
Subjective:    Patient ID: Marcus Schultz, male    DOB: July 22, 1976, 38 y.o.   MRN: [id number]  HPI Comments: He returns for a f/up on DM but he also complains that he has been losing muscle mass and feels weak in general - this has been happening over the last 3 months.  Diabetes He presents for his follow-up diabetic visit. He has type 2 diabetes mellitus. His disease course has been stable. There are no hypoglycemic associated symptoms. Pertinent negatives for hypoglycemia include no dizziness, nervousness/anxiousness or speech difficulty. Pertinent negatives for diabetes include no blurred vision, no chest pain, no fatigue, no foot paresthesias, no foot ulcerations, no polydipsia, no polyphagia, no polyuria, no visual change, no weakness and no weight loss. There are no hypoglycemic complications. Symptoms are stable. There are no diabetic complications. Current diabetic treatment includes oral agent (monotherapy). He is compliant with treatment all of the time. His weight is stable. He is following a generally healthy diet. Meal planning includes avoidance of concentrated sweets. He participates in exercise intermittently. There is no change in his home blood glucose trend. An ACE inhibitor/angiotensin II receptor blocker is not being taken. He does not see a podiatrist.Eye exam is current.      Review of Systems  Constitutional: Negative.  Negative for fever, chills, weight loss, diaphoresis, appetite change and fatigue.  HENT: Negative.   Eyes: Negative.  Negative for blurred vision.  Respiratory: Negative.  Negative for apnea, cough, choking, chest tightness, shortness of breath, wheezing and stridor.   Cardiovascular: Negative.  Negative for chest pain, palpitations and leg swelling.  Gastrointestinal: Negative.  Negative for nausea, vomiting, abdominal pain, diarrhea, constipation and blood in stool.  Endocrine: Negative.  Negative for polydipsia, polyphagia and polyuria.    Genitourinary: Negative.  Negative for frequency and difficulty urinating.  Musculoskeletal: Negative.   Skin: Negative.   Allergic/Immunologic: Negative.   Neurological: Negative.  Negative for dizziness, facial asymmetry, speech difficulty, weakness, light-headedness and numbness.  Hematological: Negative.  Negative for adenopathy. Does not bruise/bleed easily.  Psychiatric/Behavioral: Negative.  Negative for sleep disturbance, dysphoric mood and decreased concentration. The patient is not nervous/anxious.        Objective:   Physical Exam  Vitals reviewed. Constitutional: He is oriented to person, place, and time. He appears well-developed and well-nourished. No distress.  HENT:  Head: Normocephalic and atraumatic.  Mouth/Throat: Oropharynx is clear and moist. No oropharyngeal exudate.  Eyes: Conjunctivae are normal. Right eye exhibits no discharge. Left eye exhibits no discharge. No scleral icterus.  Neck: Normal range of motion. Neck supple. No JVD present. No tracheal deviation present. No thyromegaly present.  Cardiovascular: Normal rate, regular rhythm, normal heart sounds and intact distal pulses.  Exam reveals no gallop and no friction rub.   No murmur heard. Pulmonary/Chest: Effort normal and breath sounds normal. No stridor. No respiratory distress. He has no wheezes. He has no rales. He exhibits no tenderness.  Abdominal: Soft. Bowel sounds are normal. He exhibits no distension and no mass. There is no tenderness. There is no rebound and no guarding. Hernia confirmed negative in the right inguinal area and confirmed negative in the left inguinal area.  Genitourinary: Rectum normal, prostate normal, testes normal and penis normal. Rectal exam shows no external hemorrhoid, no internal hemorrhoid, no fissure, no mass, no tenderness and anal tone normal. Guaiac negative stool. Prostate is not enlarged and not tender. Right testis shows no mass, no swelling and no tenderness. Right  testis is descended.  Left testis shows no mass, no swelling and no tenderness. Left testis is descended. Circumcised. No penile erythema or penile tenderness. No discharge found.  Musculoskeletal: Normal range of motion. He exhibits no edema and no tenderness.  Lymphadenopathy:    He has no cervical adenopathy.       Right: No inguinal adenopathy present.       Left: No inguinal adenopathy present.  Neurological: He is oriented to person, place, and time.  Skin: Skin is warm and dry. No rash noted. He is not diaphoretic. No erythema. No pallor.  Psychiatric: He has a normal mood and affect. His behavior is normal. Judgment and thought content normal.     Lab Results  Component Value Date   WBC 7.3 12/02/2012   HGB 13.9 12/02/2012   HCT 41.8 12/02/2012   PLT 262.0 12/02/2012   GLUCOSE 99 12/02/2012   CHOL 153 12/02/2012   TRIG 94.0 12/02/2012   HDL 31.20* 12/02/2012   LDLCALC 103* 12/02/2012   ALT 25 12/02/2012   AST 20 12/02/2012   NA 138 12/02/2012   K 3.5 12/02/2012   CL 104 12/02/2012   CREATININE 0.9 12/02/2012   BUN 18 12/02/2012   CO2 28 12/02/2012   TSH 1.41 12/02/2012   HGBA1C 6.4 12/02/2012       Assessment & Plan:

## 2013-10-14 ENCOUNTER — Encounter: Payer: Self-pay | Admitting: Internal Medicine

## 2013-10-14 LAB — TESTOSTERONE, FREE, TOTAL, SHBG
Sex Hormone Binding: 21 nmol/L (ref 13–71)
TESTOSTERONE FREE: 77.5 pg/mL (ref 47.0–244.0)
Testosterone-% Free: 2.5 % (ref 1.6–2.9)
Testosterone: 310 ng/dL (ref 300–890)

## 2013-10-14 NOTE — Assessment & Plan Note (Signed)
His blood sugar is well controlled and his renal function is normal

## 2013-10-14 NOTE — Assessment & Plan Note (Signed)
He did not use the previous T replacement b/c it contained alcohol in the gel and that is "against my religion" I do think he is symptomatic from this so I have offered for him to start T replacement injections

## 2013-11-04 ENCOUNTER — Telehealth: Payer: Self-pay | Admitting: Internal Medicine

## 2013-11-04 NOTE — Telephone Encounter (Signed)
PT needs a PA on the testosterone injection medicine.

## 2013-11-04 NOTE — Telephone Encounter (Signed)
See pt chart, PA approved 10/25/13-10/05/38, pharmacy already notified

## 2013-11-09 ENCOUNTER — Encounter: Payer: Self-pay | Admitting: Internal Medicine

## 2013-11-23 ENCOUNTER — Ambulatory Visit (INDEPENDENT_AMBULATORY_CARE_PROVIDER_SITE_OTHER): Payer: BC Managed Care – PPO | Admitting: Internal Medicine

## 2013-11-23 ENCOUNTER — Encounter: Payer: Self-pay | Admitting: Internal Medicine

## 2013-11-23 VITALS — BP 108/70 | HR 74 | Temp 97.6°F | Wt 210.0 lb

## 2013-11-23 DIAGNOSIS — J111 Influenza due to unidentified influenza virus with other respiratory manifestations: Secondary | ICD-10-CM

## 2013-11-23 MED ORDER — AMOXICILLIN 500 MG PO CAPS: 500.0000 mg | ORAL_CAPSULE | Freq: Three times a day (TID) | ORAL | Status: DC

## 2013-11-23 MED ORDER — OSELTAMIVIR PHOSPHATE 75 MG PO CAPS: 75.0000 mg | ORAL_CAPSULE | Freq: Two times a day (BID) | ORAL | Status: DC

## 2013-11-23 NOTE — Progress Notes (Signed)
Pre visit review using our clinic review tool, if applicable. No additional management support is needed unless otherwise documented below in the visit note. 

## 2013-11-23 NOTE — Progress Notes (Signed)
   Subjective:    Patient ID: Norman Piacentini, male    DOB: June 22, 1976, 38 y.o.   MRN: [id number]  HPI Symptoms began 11/22/13 as frontal HA with clear rhinitis, progressing to cough with PND yellow secretions.  Reports itchy watery eyes, sneezing, fever of 100 this morning with chills. Describes significant muscle pain and body aches since symptoms began.  Also has L otalgia, no discharge. Patient had flu shot in January; sick contacts at work with flu.   Has taken ibuprofen only for comfort and fever reduction.     Review of Systems Denies SOB with coughing, wheezing.     Objective:   Physical Exam General appearance:good health ;well nourished; no acute distress or increased work of breathing is present.  No  lymphadenopathy about the head, neck, or axilla noted.   Eyes: No conjunctival inflammation or lid edema is present. There is no scleral icterus.  Ears:  External ear exam shows no significant lesions or deformities.  Otoscopic examination reveals clear canals, tympanic membranes are intact bilaterally without bulging, retraction, inflammation or discharge. Diffuse LR in L ear without effusion.     Nose:  External nasal examination shows no deformity or inflammation. Nasal mucosa are pink and moist without lesions or exudates. No septal dislocation or deviation.No obstruction to airflow.   Oral exam: Dental hygiene is good; lips and gums are healthy appearing.There is mild oropharyngeal erythema without exudate noted.   Neck:  No deformities, thyromegaly, masses, or tenderness noted.   Supple with full range of motion without pain.   Heart:  Normal rate and regular rhythm. S1 and S2 normal without gallop, murmur, click, rub or other extra sounds.   Lungs:Chest clear to auscultation; no wheezes, rhonchi,rales ,or rubs present.No increased work of breathing.    Extremities:  No cyanosis, edema, or clubbing  noted    Skin: Warm & dry w/o jaundice or tenting.          Assessment & Plan:  #1 influenza  See orders

## 2013-11-23 NOTE — Patient Instructions (Signed)

## 2013-11-23 NOTE — Progress Notes (Signed)
   Subjective:    Patient ID: Marcus Schultz, male    DOB: [birth year], 38 y.o.   MRN: [id number]  HPI Symptoms began 11/22/13 as sudden onset of frontal HA with clear rhinitis, progressing to cough with PND yellow secretions.  Reports itchy watery eyes, sneezing, fever of 100 this morning with chills. Describes significant muscle pain and body aches since symptoms began.  Also has L otalgia, no discharge. Patient had flu shot in January; sick contacts at work with flu.  Has taken ibuprofen only for comfort and fever reduction.   Review of Systems Denies SOB with coughing, wheezing.     Objective:   Physical Exam General appearance:good health ;well nourished; no acute distress or increased work of breathing is present. No lymphadenopathy about the head, neck, or axilla noted.  Eyes: No conjunctival inflammation or lid edema is present. There is no scleral icterus.  Ears: External ear exam shows no significant lesions or deformities. Otoscopic examination reveals clear canals, tympanic membranes are intact bilaterally without bulging, retraction, inflammation or discharge. Diffuse light reflex in L ear without effusion.  Nose: External nasal examination shows no deformity or inflammation. Nasal mucosa are dry without lesions or exudates. No septal dislocation or deviation.No obstruction to airflow.  Oral exam: Dental hygiene is good; lips and gums are healthy appearing.There is mild oropharyngeal erythema without exudate noted.  Neck: No deformities,  masses, or tenderness noted. Supple with full range of motion without pain.  Heart: Normal rate and regular rhythm. S1 and S2 normal without gallop, murmur, click, rub or other extra sounds.  Lungs:Chest clear to auscultation; no wheezes, rhonchi,rales ,or rubs present.No increased work of breathing.  Extremities: No cyanosis, edema, or clubbing noted  Skin: Warm & dry w/o jaundice or tenting.    Assessment & Plan:  #1 influenza  See orders

## 2014-02-09 ENCOUNTER — Ambulatory Visit (INDEPENDENT_AMBULATORY_CARE_PROVIDER_SITE_OTHER): Payer: BC Managed Care – PPO | Admitting: Internal Medicine

## 2014-02-09 ENCOUNTER — Encounter: Payer: Self-pay | Admitting: Internal Medicine

## 2014-02-09 ENCOUNTER — Ambulatory Visit (INDEPENDENT_AMBULATORY_CARE_PROVIDER_SITE_OTHER)
Admission: RE | Admit: 2014-02-09 | Discharge: 2014-02-09 | Disposition: A | Discharge status: Home / Self Care | Payer: BC Managed Care – PPO | Source: Ambulatory Visit | Attending: Internal Medicine | Admitting: Internal Medicine

## 2014-02-09 ENCOUNTER — Other Ambulatory Visit (INDEPENDENT_AMBULATORY_CARE_PROVIDER_SITE_OTHER): Payer: BC Managed Care – PPO

## 2014-02-09 VITALS — BP 130/88 | HR 74 | Temp 97.6°F | Resp 16 | Ht 70.0 in | Wt 208.5 lb

## 2014-02-09 DIAGNOSIS — E1165 Type 2 diabetes mellitus with hyperglycemia: Principal | ICD-10-CM

## 2014-02-09 DIAGNOSIS — IMO0001 Reserved for inherently not codable concepts without codable children: Secondary | ICD-10-CM

## 2014-02-09 DIAGNOSIS — M25559 Pain in unspecified hip: Secondary | ICD-10-CM

## 2014-02-09 DIAGNOSIS — M25552 Pain in left hip: Secondary | ICD-10-CM

## 2014-02-09 DIAGNOSIS — Z23 Encounter for immunization: Secondary | ICD-10-CM

## 2014-02-09 LAB — BASIC METABOLIC PANEL
BUN: 20 mg/dL (ref 6–23)
CO2: 27 meq/L (ref 19–32)
Calcium: 9 mg/dL (ref 8.4–10.5)
Chloride: 104 mEq/L (ref 96–112)
Creatinine, Ser: 0.9 mg/dL (ref 0.4–1.5)
GFR: 96.98 mL/min (ref >60.00)
GLUCOSE: 120 mg/dL — AB (ref 70–99)
POTASSIUM: 3.9 meq/L (ref 3.5–5.1)
SODIUM: 137 meq/L (ref 135–145)

## 2014-02-09 LAB — HEMOGLOBIN A1C: Hgb A1c MFr Bld: 6.6 % — ABNORMAL HIGH (ref 4.6–6.5)

## 2014-02-09 MED ORDER — SITAGLIP PHOS-METFORMIN HCL ER 50-1000 MG PO TB24: 1.0000 | ORAL_TABLET | Freq: Every day | ORAL | Status: DC

## 2014-02-09 NOTE — Progress Notes (Signed)
Pre visit review using our clinic review tool, if applicable. No additional management support is needed unless otherwise documented below in the visit note. 

## 2014-02-09 NOTE — Progress Notes (Signed)
Subjective:    Patient ID: Marcus Schultz, male    DOB: 04-Nov-1975, 38 y.o.   MRN: [id number]  Diabetes He presents for his follow-up diabetic visit. He has type 2 diabetes mellitus. His disease course has been fluctuating. Pertinent negatives for diabetes include no blurred vision, no chest pain, no fatigue, no foot paresthesias, no foot ulcerations, no polydipsia, no polyphagia, no polyuria, no visual change, no weakness and no weight loss. There are no diabetic complications. Current diabetic treatment includes oral agent (monotherapy). He is compliant with treatment all of the time. His weight is stable. He is following a generally healthy diet. Meal planning includes avoidance of concentrated sweets. He participates in exercise intermittently. His breakfast blood glucose range is generally 130-140 mg/dl. His lunch blood glucose range is generally 140-180 mg/dl. His dinner blood glucose range is generally 140-180 mg/dl. His highest blood glucose is 140-180 mg/dl. His overall blood glucose range is 140-180 mg/dl. An ACE inhibitor/angiotensin II receptor blocker is not being taken. He does not see a podiatrist.Eye exam is current.      Review of Systems  Constitutional: Negative.  Negative for fever, chills, weight loss, diaphoresis, appetite change and fatigue.  HENT: Negative.   Eyes: Negative.  Negative for blurred vision.  Respiratory: Negative.   Cardiovascular: Negative.  Negative for chest pain, palpitations and leg swelling.  Gastrointestinal: Negative.  Negative for nausea, vomiting, abdominal pain, diarrhea, constipation and blood in stool.  Endocrine: Negative.  Negative for polydipsia, polyphagia and polyuria.  Genitourinary: Negative.   Musculoskeletal: Positive for arthralgias. Negative for back pain, gait problem, joint swelling, myalgias, neck pain and neck stiffness.       He developed left anterior hip pain 2 weeks ago, there was initially some bruising but he does not recall  an injury, the bruising resolved but there is still some pain.  Skin: Negative.   Allergic/Immunologic: Negative.   Neurological: Negative.  Negative for weakness.  Hematological: Negative.  Negative for adenopathy. Does not bruise/bleed easily.  Psychiatric/Behavioral: Negative.        Objective:   Physical Exam  Vitals reviewed. Constitutional: He is oriented to person, place, and time. He appears well-developed and well-nourished. No distress.  HENT:  Head: Normocephalic and atraumatic.  Mouth/Throat: Oropharynx is clear and moist. No oropharyngeal exudate.  Eyes: Conjunctivae are normal. Right eye exhibits no discharge. Left eye exhibits no discharge. No scleral icterus.  Neck: Normal range of motion. Neck supple. No JVD present. No tracheal deviation present. No thyromegaly present.  Cardiovascular: Normal rate, regular rhythm, normal heart sounds and intact distal pulses.  Exam reveals no gallop and no friction rub.   No murmur heard. Pulmonary/Chest: Effort normal and breath sounds normal. No stridor. No respiratory distress. He has no wheezes. He has no rales. He exhibits no tenderness.  Abdominal: Soft. Bowel sounds are normal. He exhibits no distension and no mass. There is no tenderness. There is no rebound and no guarding. Hernia confirmed negative in the right inguinal area and confirmed negative in the left inguinal area.  Musculoskeletal: Normal range of motion. He exhibits no edema and no tenderness.       Left hip: Normal. He exhibits normal range of motion, normal strength, no tenderness, no bony tenderness, no swelling, no crepitus, no deformity and no laceration.  Lymphadenopathy:    He has no cervical adenopathy.       Right: No inguinal adenopathy present.       Left: No inguinal adenopathy present.  Neurological: He is oriented to person, place, and time.  Skin: Skin is warm and dry. No rash noted. He is not diaphoretic. No erythema. No pallor.  Psychiatric: He  has a normal mood and affect. His behavior is normal. Judgment and thought content normal.    Lab Results  Component Value Date   WBC 8.9 10/13/2013   HGB 14.9 10/13/2013   HCT 44.9 10/13/2013   PLT 333.0 10/13/2013   GLUCOSE 96 10/13/2013   CHOL 201* 10/13/2013   TRIG 261.0* 10/13/2013   HDL 32.30* 10/13/2013   LDLDIRECT 136.1 10/13/2013   LDLCALC 103* 12/02/2012   ALT 37 10/13/2013   AST 21 10/13/2013   NA 141 10/13/2013   K 4.2 10/13/2013   CL 104 10/13/2013   CREATININE 0.9 10/13/2013   BUN 15 10/13/2013   CO2 33* 10/13/2013   TSH 1.00 10/13/2013   PSA 0.91 10/13/2013   HGBA1C 6.4 10/13/2013        Assessment & Plan:

## 2014-02-09 NOTE — Patient Instructions (Signed)
Hip Pain  The hips join the upper legs to the lower pelvis. The bones, cartilage, tendons, and muscles of the hip joint perform a lot of work each day holding your body weight and allowing you to move around.  Hip pain is a common symptom. It can range from a minor ache to severe pain on 1 or both hips. Pain may be felt on the inside of the hip joint near the groin, or the outside near the buttocks and upper thigh. There may be swelling or stiffness as well. It occurs more often when a person walks or performs activity. There are many reasons hip pain can develop.  CAUSES   It is important to work with your caregiver to identify the cause since many conditions can impact the bones, cartilage, muscles, and tendons of the hips. Causes for hip pain include:   Broken (fractured) bones.   Separation of the thighbone from the hip socket (dislocation).   Torn cartilage of the hip joint.   Swelling (inflammation) of a tendon (tendonitis), the sac within the hip joint (bursitis), or a joint.   A weakening in the abdominal wall (hernia), affecting the nerves to the hip.   Arthritis in the hip joint or lining of the hip joint.   Pinched nerves in the back, hip, or upper thigh.   A bulging disc in the spine (herniated disc).   Rarely, bone infection or cancer.  DIAGNOSIS   The location of your hip pain will help your caregiver understand what may be causing the pain. A diagnosis is based on your medical history, your symptoms, results from your physical exam, and results from diagnostic tests. Diagnostic tests may include X-ray exams, a computerized magnetic scan (magnetic resonance imaging, MRI), or bone scan.  TREATMENT   Treatment will depend on the cause of your hip pain. Treatment may include:   Limiting activities and resting until symptoms improve.   Crutches or other walking supports (a cane or brace).   Ice, elevation, and compression.   Physical therapy or home exercises.   Shoe inserts or special  shoes.   Losing weight.   Medications to reduce pain.   Undergoing surgery.  HOME CARE INSTRUCTIONS    Only take over-the-counter or prescription medicines for pain, discomfort, or fever as directed by your caregiver.   Put ice on the injured area:   Put ice in a plastic bag.   Place a towel between your skin and the bag.   Leave the ice on for 15-20 minutes at a time, 03-04 times a day.   Keep your leg raised (elevated) when possible to lessen swelling.   Avoid activities that cause pain.   Follow specific exercises as directed by your caregiver.   Sleep with a pillow between your legs on your most comfortable side.   Record how often you have hip pain, the location of the pain, and what it feels like. This information may be helpful to you and your caregiver.   Ask your caregiver about returning to work or sports and whether you should drive.   Follow up with your caregiver for further exams, therapy, or testing as directed.  SEEK MEDICAL CARE IF:    Your pain or swelling continues or worsens after 1 week.   You are feeling unwell or have chills.   You have increasing difficulty with walking.   You have a loss of sensation or other new symptoms.   You have questions or concerns.  SEEK   IMMEDIATE MEDICAL CARE IF:    You cannot put weight on the affected hip.   You have fallen.   You have a sudden increase in pain and swelling in your hip.   You have a fever.  MAKE SURE YOU:    Understand these instructions.   Will watch your condition.   Will get help right away if you are not doing well or get worse.  Document Released: 03/12/2010 Document Revised: 12/15/2011 Document Reviewed: 03/12/2010  [employer] Patient Information 2014 [employer].

## 2014-02-10 ENCOUNTER — Encounter: Payer: Self-pay | Admitting: Internal Medicine

## 2014-02-10 NOTE — Assessment & Plan Note (Signed)
Exam and plain films are normal Will treat for MS strain with rest, ice and nsaids.

## 2014-02-10 NOTE — Assessment & Plan Note (Signed)
He wants to get better control of his blood sugars so will upgrade to [id number] His A1C shows good control thus far

## 2014-02-28 ENCOUNTER — Encounter: Payer: Self-pay | Admitting: Internal Medicine

## 2014-02-28 DIAGNOSIS — IMO0001 Reserved for inherently not codable concepts without codable children: Secondary | ICD-10-CM

## 2014-02-28 DIAGNOSIS — E1165 Type 2 diabetes mellitus with hyperglycemia: Principal | ICD-10-CM

## 2014-02-28 MED ORDER — SITAGLIP PHOS-METFORMIN HCL ER 50-1000 MG PO TB24
1.0000 | ORAL_TABLET | Freq: Every day | ORAL | Status: DC
Start: 2014-02-28 — End: 2015-04-15

## 2014-06-20 ENCOUNTER — Ambulatory Visit (INDEPENDENT_AMBULATORY_CARE_PROVIDER_SITE_OTHER)
Admission: RE | Admit: 2014-06-20 | Discharge: 2014-06-20 | Disposition: A | Discharge status: Home / Self Care | Payer: BC Managed Care – PPO | Source: Ambulatory Visit | Attending: Internal Medicine | Admitting: Internal Medicine

## 2014-06-20 ENCOUNTER — Encounter: Payer: Self-pay | Admitting: Internal Medicine

## 2014-06-20 ENCOUNTER — Ambulatory Visit (INDEPENDENT_AMBULATORY_CARE_PROVIDER_SITE_OTHER): Payer: BC Managed Care – PPO | Admitting: Internal Medicine

## 2014-06-20 ENCOUNTER — Other Ambulatory Visit (INDEPENDENT_AMBULATORY_CARE_PROVIDER_SITE_OTHER): Payer: BC Managed Care – PPO

## 2014-06-20 VITALS — BP 140/90 | HR 77 | Temp 97.8°F | Resp 16 | Ht 70.0 in | Wt 210.4 lb

## 2014-06-20 DIAGNOSIS — IMO0001 Reserved for inherently not codable concepts without codable children: Secondary | ICD-10-CM

## 2014-06-20 DIAGNOSIS — R52 Pain, unspecified: Secondary | ICD-10-CM

## 2014-06-20 DIAGNOSIS — N529 Male erectile dysfunction, unspecified: Secondary | ICD-10-CM

## 2014-06-20 DIAGNOSIS — R109 Unspecified abdominal pain: Secondary | ICD-10-CM

## 2014-06-20 DIAGNOSIS — R0989 Other specified symptoms and signs involving the circulatory and respiratory systems: Secondary | ICD-10-CM

## 2014-06-20 DIAGNOSIS — N521 Erectile dysfunction due to diseases classified elsewhere: Secondary | ICD-10-CM

## 2014-06-20 DIAGNOSIS — R0609 Other forms of dyspnea: Secondary | ICD-10-CM

## 2014-06-20 DIAGNOSIS — J309 Allergic rhinitis, unspecified: Secondary | ICD-10-CM

## 2014-06-20 DIAGNOSIS — E1165 Type 2 diabetes mellitus with hyperglycemia: Secondary | ICD-10-CM

## 2014-06-20 DIAGNOSIS — Z23 Encounter for immunization: Secondary | ICD-10-CM

## 2014-06-20 DIAGNOSIS — E291 Testicular hypofunction: Secondary | ICD-10-CM

## 2014-06-20 LAB — COMPREHENSIVE METABOLIC PANEL
ALT: 30 U/L (ref 0–53)
AST: 30 U/L (ref 0–37)
Albumin: 3.8 g/dL (ref 3.5–5.2)
Alkaline Phosphatase: 71 U/L (ref 39–117)
BUN: 19 mg/dL (ref 6–23)
CALCIUM: 9 mg/dL (ref 8.4–10.5)
CHLORIDE: 105 meq/L (ref 96–112)
CO2: 26 meq/L (ref 19–32)
Creatinine, Ser: 0.9 mg/dL (ref 0.4–1.5)
GFR: 104.54 mL/min (ref >60.00)
Glucose, Bld: 106 mg/dL — ABNORMAL HIGH (ref 70–99)
POTASSIUM: 3.8 meq/L (ref 3.5–5.1)
SODIUM: 140 meq/L (ref 135–145)
TOTAL PROTEIN: 7.1 g/dL (ref 6.0–8.3)
Total Bilirubin: 0.4 mg/dL (ref 0.2–1.2)

## 2014-06-20 LAB — URINALYSIS, ROUTINE W REFLEX MICROSCOPIC
BILIRUBIN URINE: NEGATIVE
Hgb urine dipstick: NEGATIVE
Ketones, ur: NEGATIVE
Leukocytes, UA: NEGATIVE
Nitrite: NEGATIVE
RBC / HPF: NONE SEEN (ref 0–?)
Specific Gravity, Urine: <=1.005 — AB (ref 1.000–1.030)
TOTAL PROTEIN, URINE-UPE24: NEGATIVE
URINE GLUCOSE: NEGATIVE
UROBILINOGEN UA: 0.2 (ref 0.0–1.0)
WBC UA: NONE SEEN (ref 0–?)
pH: 6.5 (ref 5.0–8.0)

## 2014-06-20 LAB — TSH: TSH: 1.14 u[IU]/mL (ref 0.35–4.50)

## 2014-06-20 LAB — LIPASE: Lipase: 41 U/L (ref 11.0–59.0)

## 2014-06-20 LAB — HEMOGLOBIN A1C: HEMOGLOBIN A1C: 6.6 % — AB (ref 4.6–6.5)

## 2014-06-20 LAB — AMYLASE: AMYLASE: 76 U/L (ref 27–131)

## 2014-06-20 MED ORDER — TESTOSTERONE ENANTHATE 200 MG/ML IM SOLN
200.0000 mg | INTRAMUSCULAR | Status: DC
  Administered 2014-06-20: 200 mg via INTRAMUSCULAR

## 2014-06-20 MED ORDER — VARDENAFIL HCL 20 MG PO TABS: 20.0000 mg | ORAL_TABLET | Freq: Every day | ORAL | Status: DC | PRN

## 2014-06-20 NOTE — Progress Notes (Signed)
Subjective:    Patient ID: Marcus Schultz, male    DOB: 1976/01/02, 38 y.o.   MRN: [id number]  Erectile Dysfunction This is a recurrent problem. The current episode started more than 1 month ago. The problem is unchanged. The nature of his difficulty is achieving erection, maintaining erection and penetration. He reports no anxiety or performance anxiety. Irritative symptoms do not include frequency, nocturia or urgency. Obstructive symptoms do not include dribbling, incomplete emptying, an intermittent stream, a slower stream, straining or a weak stream. Pertinent negatives include no chills, dysuria, genital pain, hematuria, hesitancy or inability to urinate. The symptoms are aggravated by medications. Past treatments include nothing. Risk factors include diabetes mellitus and hypertension.  Flank Pain This is a new problem. The current episode started in the past 7 days. The problem occurs intermittently. The problem is unchanged. Pain location: left flank and LUQ. The quality of the pain is described as aching. The pain does not radiate. The pain is at a severity of 1/10. The pain is mild. The pain is worse during the day. Associated symptoms include abdominal pain. Pertinent negatives include no bladder incontinence, bowel incontinence, chest pain, dysuria, fever, headaches, leg pain, numbness, paresis, paresthesias, pelvic pain, perianal numbness, tingling, weakness or weight loss. He has tried nothing for the symptoms.      Review of Systems  Constitutional: Negative.  Negative for fever, chills, weight loss, diaphoresis, appetite change and fatigue.  HENT: Negative.   Eyes: Negative.   Respiratory: Negative.  Negative for apnea, cough, choking, chest tightness, shortness of breath, wheezing and stridor.   Cardiovascular: Negative.  Negative for chest pain, palpitations and leg swelling.  Gastrointestinal: Positive for abdominal pain. Negative for nausea, vomiting, diarrhea, constipation,  blood in stool, abdominal distention, anal bleeding, rectal pain and bowel incontinence.  Endocrine: Negative.   Genitourinary: Positive for flank pain. Negative for bladder incontinence, dysuria, hesitancy, urgency, frequency, hematuria, decreased urine volume, discharge, penile swelling, scrotal swelling, enuresis, difficulty urinating, genital sores, penile pain, testicular pain, pelvic pain, incomplete emptying and nocturia.  Musculoskeletal: Negative.   Skin: Negative.  Negative for rash.  Allergic/Immunologic: Negative.   Neurological: Negative.  Negative for tingling, weakness, numbness, headaches and paresthesias.  Hematological: Negative.  Negative for adenopathy. Does not bruise/bleed easily.  Psychiatric/Behavioral: Negative.        Objective:   Physical Exam  Vitals reviewed. Constitutional: He is oriented to person, place, and time. He appears well-developed and well-nourished.  Non-toxic appearance. He does not have a sickly appearance. He does not appear ill. No distress.  HENT:  Head: Normocephalic and atraumatic.  Mouth/Throat: Oropharynx is clear and moist. No oropharyngeal exudate.  Eyes: Conjunctivae are normal. Right eye exhibits no discharge. Left eye exhibits no discharge. No scleral icterus.  Neck: Normal range of motion. Neck supple. No JVD present. No tracheal deviation present. No thyromegaly present.  Cardiovascular: Normal rate, regular rhythm, normal heart sounds and intact distal pulses.  Exam reveals no gallop and no friction rub.   No murmur heard. Pulmonary/Chest: Effort normal and breath sounds normal. No stridor. No respiratory distress. He has no wheezes. He has no rales. He exhibits no tenderness.  Abdominal: Soft. Normal appearance and bowel sounds are normal. He exhibits no shifting dullness, no distension, no pulsatile liver, no fluid wave, no abdominal bruit, no ascites, no pulsatile midline mass and no mass. There is no hepatosplenomegaly,  splenomegaly or hepatomegaly. There is tenderness in the left upper quadrant. There is no rigidity, no rebound, no  guarding, no CVA tenderness, no tenderness at McBurney's point and negative Murphy's sign. No hernia. Hernia confirmed negative in the ventral area, confirmed negative in the right inguinal area and confirmed negative in the left inguinal area.  Musculoskeletal: Normal range of motion. He exhibits no edema and no tenderness.  Lymphadenopathy:    He has no cervical adenopathy.  Neurological: He is oriented to person, place, and time.  Skin: Skin is warm and dry. No rash noted. He is not diaphoretic. No erythema. No pallor.  Psychiatric: He has a normal mood and affect. His behavior is normal. Judgment and thought content normal.     Lab Results  Component Value Date   WBC 8.9 10/13/2013   HGB 14.9 10/13/2013   HCT 44.9 10/13/2013   PLT 333.0 10/13/2013   GLUCOSE 120* 02/09/2014   CHOL 201* 10/13/2013   TRIG 261.0* 10/13/2013   HDL 32.30* 10/13/2013   LDLDIRECT 136.1 10/13/2013   LDLCALC 103* 12/02/2012   ALT 37 10/13/2013   AST 21 10/13/2013   NA 137 02/09/2014   K 3.9 02/09/2014   CL 104 02/09/2014   CREATININE 0.9 02/09/2014   BUN 20 02/09/2014   CO2 27 02/09/2014   TSH 1.00 10/13/2013   PSA 0.91 10/13/2013   HGBA1C 6.6* 02/09/2014       Assessment & Plan:

## 2014-06-20 NOTE — Patient Instructions (Signed)
Flank Pain °Flank pain refers to pain that is located on the side of the body between the upper abdomen and the back. The pain may occur over a short period of time (acute) or may be long-term or reoccurring (chronic). It may be mild or severe. Flank pain can be caused by many things. °CAUSES  °Some of the more common causes of flank pain include: °· Muscle strains.   °· Muscle spasms.   °· A disease of your spine (vertebral disk disease).   °· A lung infection (pneumonia).   °· Fluid around your lungs (pulmonary edema).   °· A kidney infection.   °· Kidney stones.   °· A very painful skin rash caused by the chickenpox virus (shingles).   °· Gallbladder disease.   °HOME CARE INSTRUCTIONS  °Home care will depend on the cause of your pain. In general, °· Rest as directed by your caregiver. °· Drink enough fluids to keep your urine clear or pale yellow. °· Only take over-the-counter or prescription medicines as directed by your caregiver. Some medicines may help relieve the pain. °· Tell your caregiver about any changes in your pain. °· Follow up with your caregiver as directed. °SEEK IMMEDIATE MEDICAL CARE IF:  °· Your pain is not controlled with medicine.   °· You have new or worsening symptoms. °· Your pain increases.   °· You have abdominal pain.   °· You have shortness of breath.   °· You have persistent nausea or vomiting.   °· You have swelling in your abdomen.   °· You feel faint or pass out.   °· You have blood in your urine. °· You have a fever or persistent symptoms for more than 2-3 days. °· You have a fever and your symptoms suddenly get worse. °MAKE SURE YOU:  °· Understand these instructions. °· Will watch your condition. °· Will get help right away if you are not doing well or get worse. °Document Released: 11/13/2005 Document Revised: 06/16/2012 Document Reviewed: 05/06/2012 °Exit[employer]® Patient Information ©2015 [employer]. This information is not intended to replace advice given to you by your  health care provider. Make sure you discuss any questions you have with your health care provider. ° °

## 2014-06-20 NOTE — Progress Notes (Signed)
Pre visit review using our clinic review tool, if applicable. No additional management support is needed unless otherwise documented below in the visit note. 

## 2014-06-20 NOTE — Assessment & Plan Note (Signed)
I will recheck his A1C and will monitor his renal function 

## 2014-06-20 NOTE — Assessment & Plan Note (Signed)
He will try levitra for this

## 2014-06-20 NOTE — Assessment & Plan Note (Signed)
Exam is benign, he does not appear to have an acute process I will check his labs to look for organic pathology Will check a plain film to look for renal stones, stool retention, SBO, abnormal gas pattern

## 2014-06-21 ENCOUNTER — Encounter: Payer: Self-pay | Admitting: Internal Medicine

## 2014-06-21 LAB — CBC WITH DIFFERENTIAL/PLATELET
BASOS ABS: 0 10*3/uL (ref 0.0–0.1)
Basophils Relative: 0.3 % (ref 0.0–3.0)
EOS ABS: 0.3 10*3/uL (ref 0.0–0.7)
Eosinophils Relative: 3.4 % (ref 0.0–5.0)
HCT: 44.6 % (ref 39.0–52.0)
Hemoglobin: 14.8 g/dL (ref 13.0–17.0)
LYMPHS ABS: 1.7 10*3/uL (ref 0.7–4.0)
LYMPHS PCT: 21.1 % (ref 12.0–46.0)
MCHC: 33 g/dL (ref 30.0–36.0)
MCV: 77.4 fl — ABNORMAL LOW (ref 78.0–100.0)
Monocytes Absolute: 0.5 10*3/uL (ref 0.1–1.0)
Monocytes Relative: 6.3 % (ref 3.0–12.0)
NEUTROS ABS: 5.6 10*3/uL (ref 1.4–7.7)
Neutrophils Relative %: 68.9 % (ref 43.0–77.0)
Platelets: 266 10*3/uL (ref 150.0–400.0)
RBC: 5.76 Mil/uL (ref 4.22–5.81)
RDW: 14.1 % (ref 11.5–15.5)
WBC: 8.1 10*3/uL (ref 4.0–10.5)

## 2014-07-19 LAB — HM DIABETES EYE EXAM

## 2014-09-20 ENCOUNTER — Encounter: Payer: Self-pay | Admitting: Internal Medicine

## 2014-09-20 ENCOUNTER — Ambulatory Visit (INDEPENDENT_AMBULATORY_CARE_PROVIDER_SITE_OTHER)
Admission: RE | Admit: 2014-09-20 | Discharge: 2014-09-20 | Disposition: A | Discharge status: Home / Self Care | Payer: BC Managed Care – PPO | Source: Ambulatory Visit | Attending: Internal Medicine | Admitting: Internal Medicine

## 2014-09-20 ENCOUNTER — Other Ambulatory Visit (INDEPENDENT_AMBULATORY_CARE_PROVIDER_SITE_OTHER): Payer: BC Managed Care – PPO

## 2014-09-20 ENCOUNTER — Ambulatory Visit (INDEPENDENT_AMBULATORY_CARE_PROVIDER_SITE_OTHER): Payer: BC Managed Care – PPO | Admitting: Internal Medicine

## 2014-09-20 VITALS — BP 130/90 | HR 83 | Temp 98.2°F | Ht 70.0 in | Wt 212.0 lb

## 2014-09-20 DIAGNOSIS — M542 Cervicalgia: Secondary | ICD-10-CM

## 2014-09-20 DIAGNOSIS — E291 Testicular hypofunction: Secondary | ICD-10-CM

## 2014-09-20 DIAGNOSIS — E785 Hyperlipidemia, unspecified: Secondary | ICD-10-CM | POA: Insufficient documentation

## 2014-09-20 DIAGNOSIS — J0120 Acute ethmoidal sinusitis, unspecified: Secondary | ICD-10-CM

## 2014-09-20 DIAGNOSIS — R7989 Other specified abnormal findings of blood chemistry: Secondary | ICD-10-CM | POA: Insufficient documentation

## 2014-09-20 DIAGNOSIS — E119 Type 2 diabetes mellitus without complications: Secondary | ICD-10-CM

## 2014-09-20 LAB — URINALYSIS, ROUTINE W REFLEX MICROSCOPIC
Bilirubin Urine: NEGATIVE
Hgb urine dipstick: NEGATIVE
KETONES UR: NEGATIVE
Leukocytes, UA: NEGATIVE
Nitrite: NEGATIVE
RBC / HPF: NONE SEEN (ref 0–?)
Specific Gravity, Urine: <=1.005 — AB (ref 1.000–1.030)
Total Protein, Urine: NEGATIVE
UROBILINOGEN UA: 0.2 (ref 0.0–1.0)
Urine Glucose: NEGATIVE
WBC UA: NONE SEEN (ref 0–?)
pH: 7 (ref 5.0–8.0)

## 2014-09-20 LAB — C-REACTIVE PROTEIN: CRP: <0.5 mg/dL (ref 0.5–20.0)

## 2014-09-20 LAB — COMPREHENSIVE METABOLIC PANEL
ALBUMIN: 4.3 g/dL (ref 3.5–5.2)
ALT: 35 U/L (ref 0–53)
AST: 26 U/L (ref 0–37)
Alkaline Phosphatase: 72 U/L (ref 39–117)
BUN: 15 mg/dL (ref 6–23)
CO2: 27 mEq/L (ref 19–32)
Calcium: 9.2 mg/dL (ref 8.4–10.5)
Chloride: 105 mEq/L (ref 96–112)
Creatinine, Ser: 0.9 mg/dL (ref 0.4–1.5)
GFR: 97.88 mL/min (ref >60.00)
Glucose, Bld: 109 mg/dL — ABNORMAL HIGH (ref 70–99)
POTASSIUM: 4.5 meq/L (ref 3.5–5.1)
Sodium: 136 mEq/L (ref 135–145)
Total Bilirubin: 0.6 mg/dL (ref 0.2–1.2)
Total Protein: 7.3 g/dL (ref 6.0–8.3)

## 2014-09-20 LAB — MICROALBUMIN / CREATININE URINE RATIO
Creatinine,U: 26.4 mg/dL
Microalb Creat Ratio: 0.4 mg/g (ref 0.0–30.0)
Microalb, Ur: 0.1 mg/dL (ref 0.0–1.9)

## 2014-09-20 LAB — CBC WITH DIFFERENTIAL/PLATELET
BASOS PCT: 0.4 % (ref 0.0–3.0)
Basophils Absolute: 0 10*3/uL (ref 0.0–0.1)
Eosinophils Absolute: 0.2 10*3/uL (ref 0.0–0.7)
Eosinophils Relative: 2.1 % (ref 0.0–5.0)
HEMATOCRIT: 46.2 % (ref 39.0–52.0)
HEMOGLOBIN: 15 g/dL (ref 13.0–17.0)
Lymphocytes Relative: 26 % (ref 12.0–46.0)
Lymphs Abs: 2.9 10*3/uL (ref 0.7–4.0)
MCHC: 32.4 g/dL (ref 30.0–36.0)
MCV: 77.3 fl — ABNORMAL LOW (ref 78.0–100.0)
MONO ABS: 0.7 10*3/uL (ref 0.1–1.0)
Monocytes Relative: 6.3 % (ref 3.0–12.0)
NEUTROS ABS: 7.2 10*3/uL (ref 1.4–7.7)
Neutrophils Relative %: 65.2 % (ref 43.0–77.0)
Platelets: 337 10*3/uL (ref 150.0–400.0)
RBC: 5.97 Mil/uL — ABNORMAL HIGH (ref 4.22–5.81)
RDW: 14 % (ref 11.5–15.5)
WBC: 11.1 10*3/uL — ABNORMAL HIGH (ref 4.0–10.5)

## 2014-09-20 LAB — LIPID PANEL
Cholesterol: 197 mg/dL (ref 0–200)
HDL: 30.4 mg/dL — ABNORMAL LOW (ref >39.00)
LDL Cholesterol: 133 mg/dL — ABNORMAL HIGH (ref 0–99)
NonHDL: 166.6
TRIGLYCERIDES: 170 mg/dL — AB (ref 0.0–149.0)
Total CHOL/HDL Ratio: 6
VLDL: 34 mg/dL (ref 0.0–40.0)

## 2014-09-20 LAB — SEDIMENTATION RATE: Sed Rate: 3 mm/hr (ref 0–22)

## 2014-09-20 LAB — TSH: TSH: 1.22 u[IU]/mL (ref 0.35–4.50)

## 2014-09-20 MED ORDER — TESTOSTERONE CYPIONATE 200 MG/ML IM SOLN: 200.0000 mg | INTRAMUSCULAR | Status: DC

## 2014-09-20 MED ORDER — AMOXICILLIN-POT CLAVULANATE 875-125 MG PO TABS: 1.0000 | ORAL_TABLET | Freq: Two times a day (BID) | ORAL | Status: DC

## 2014-09-20 NOTE — Patient Instructions (Signed)

## 2014-09-20 NOTE — Progress Notes (Signed)
Subjective:    Patient ID: Marcus Schultz, male    DOB: 24-Jul-1976, 38 y.o.   MRN: [id number]  Neck Pain  This is a recurrent problem. The current episode started 1 to 4 weeks ago. The problem occurs intermittently. The problem has been unchanged. The pain is associated with nothing. The pain is present in the midline. The quality of the pain is described as aching. The pain is at a severity of 2/10. The pain is mild. Nothing aggravates the symptoms. The pain is same all the time. Pertinent negatives include no chest pain, fever, headaches, leg pain, numbness, pain with swallowing, paresis, photophobia, syncope, tingling, trouble swallowing, visual change, weakness or weight loss. He has tried NSAIDs for the symptoms. The treatment provided moderate relief.      Review of Systems  Constitutional: Negative.  Negative for fever, chills, weight loss, diaphoresis, appetite change and fatigue.  HENT: Positive for postnasal drip, rhinorrhea, sinus pressure and sore throat. Negative for congestion, nosebleeds, tinnitus, trouble swallowing and voice change.   Eyes: Negative.  Negative for photophobia.  Respiratory: Negative.  Negative for cough, choking, chest tightness, shortness of breath and stridor.   Cardiovascular: Negative.  Negative for chest pain, palpitations, leg swelling and syncope.  Gastrointestinal: Negative.  Negative for nausea, vomiting, abdominal pain, diarrhea and constipation.  Endocrine: Negative.   Genitourinary: Negative.   Musculoskeletal: Positive for neck pain. Negative for back pain and arthralgias.  Skin: Negative.  Negative for rash.  Allergic/Immunologic: Negative.   Neurological: Negative.  Negative for dizziness, tingling, weakness, light-headedness, numbness and headaches.  Hematological: Negative.  Negative for adenopathy. Does not bruise/bleed easily.  Psychiatric/Behavioral: Negative.        Objective:   Physical Exam  Constitutional: He is oriented to  person, place, and time. He appears well-developed and well-nourished.  Non-toxic appearance. He does not have a sickly appearance. He does not appear ill. No distress.  HENT:  Right Ear: Hearing, tympanic membrane, external ear and ear canal normal.  Left Ear: Hearing, tympanic membrane, external ear and ear canal normal.  Nose: Rhinorrhea present. No mucosal edema, sinus tenderness or nasal deformity.  No foreign bodies. Right sinus exhibits maxillary sinus tenderness. Right sinus exhibits no frontal sinus tenderness. Left sinus exhibits maxillary sinus tenderness. Left sinus exhibits no frontal sinus tenderness.  Mouth/Throat: Oropharynx is clear and moist and mucous membranes are normal. Mucous membranes are not pale, not dry and not cyanotic. No oral lesions. No trismus in the jaw. No uvula swelling. No oropharyngeal exudate, posterior oropharyngeal edema, posterior oropharyngeal erythema or tonsillar abscesses.  Eyes: Conjunctivae are normal. Right eye exhibits no discharge. Left eye exhibits no discharge. No scleral icterus.  Neck: Normal range of motion. Neck supple. No JVD present. No tracheal deviation present. No thyromegaly present.  Cardiovascular: Normal rate, regular rhythm, normal heart sounds and intact distal pulses.  Exam reveals no gallop and no friction rub.   No murmur heard. Pulmonary/Chest: Effort normal and breath sounds normal. No stridor. No respiratory distress. He has no wheezes. He has no rales. He exhibits no tenderness.  Abdominal: Soft. Bowel sounds are normal. He exhibits no distension and no mass. There is no tenderness. There is no rebound and no guarding.  Musculoskeletal: Normal range of motion. He exhibits no edema or tenderness.       Cervical back: Normal. He exhibits normal range of motion, no tenderness, no bony tenderness, no swelling, no edema, no deformity, no laceration, no pain, no spasm and normal  pulse.  Lymphadenopathy:    He has no cervical  adenopathy.  Neurological: He is oriented to person, place, and time.  Skin: Skin is warm and dry. No rash noted. He is not diaphoretic. No erythema. No pallor.  Vitals reviewed.     Lab Results  Component Value Date   WBC 8.1 06/20/2014   HGB 14.8 06/20/2014   HCT 44.6 06/20/2014   PLT 266.0 06/20/2014   GLUCOSE 106* 06/20/2014   CHOL 201* 10/13/2013   TRIG 261.0* 10/13/2013   HDL 32.30* 10/13/2013   LDLDIRECT 136.1 10/13/2013   LDLCALC 103* 12/02/2012   ALT 30 06/20/2014   AST 30 06/20/2014   NA 140 06/20/2014   K 3.8 06/20/2014   CL 105 06/20/2014   CREATININE 0.9 06/20/2014   BUN 19 06/20/2014   CO2 26 06/20/2014   TSH 1.14 06/20/2014   PSA 0.91 10/13/2013   HGBA1C 6.6* 06/20/2014      Assessment & Plan:

## 2014-09-21 NOTE — Assessment & Plan Note (Addendum)
Exam and labs are normal Plain films show spondylosis and apparent spinal stenosis I have informed him of these results and he will let me know if wants to proceed with a referral to pain management

## 2014-09-22 ENCOUNTER — Telehealth: Payer: Self-pay | Admitting: *Deleted

## 2014-09-22 ENCOUNTER — Other Ambulatory Visit: Payer: Self-pay | Admitting: Internal Medicine

## 2014-09-22 DIAGNOSIS — N521 Erectile dysfunction due to diseases classified elsewhere: Secondary | ICD-10-CM

## 2014-09-22 DIAGNOSIS — M542 Cervicalgia: Secondary | ICD-10-CM

## 2014-09-22 MED ORDER — TADALAFIL 5 MG PO TABS: 5.0000 mg | ORAL_TABLET | Freq: Every day | ORAL | Status: AC | PRN

## 2014-09-22 NOTE — Telephone Encounter (Signed)
Called Patient & Phone is Disconnected

## 2014-09-26 ENCOUNTER — Telehealth: Payer: Self-pay | Admitting: Internal Medicine

## 2014-09-26 NOTE — Telephone Encounter (Signed)
Patient is calling to check on PA for Cialis

## 2014-09-27 NOTE — Telephone Encounter (Signed)
I called ins- PA form is being faxed.

## 2014-09-29 ENCOUNTER — Other Ambulatory Visit: Payer: Self-pay | Admitting: Internal Medicine

## 2014-10-02 ENCOUNTER — Other Ambulatory Visit: Payer: Self-pay | Admitting: Internal Medicine

## 2015-01-11 ENCOUNTER — Other Ambulatory Visit: Payer: Self-pay | Admitting: Internal Medicine

## 2015-01-12 ENCOUNTER — Telehealth: Payer: Self-pay

## 2015-01-12 NOTE — Telephone Encounter (Signed)
Received pharmacy rejection stating that insurance will not cover Janument XR  without a prior authorization. Pa submitted to insurance via covermymeds, approval now pending there decision.

## 2015-01-17 NOTE — Telephone Encounter (Signed)
Called both home and mobile phone numbers, no answer and one d/c

## 2015-01-17 NOTE — Telephone Encounter (Signed)
See previous phone note. PA was approved ad pharmacy notified as of 01/16/15

## 2015-01-17 NOTE — Telephone Encounter (Signed)
Pt called stated insurance fax information yesterday to our office stated that they need more information for the PA. Please check

## 2015-02-08 ENCOUNTER — Ambulatory Visit (INDEPENDENT_AMBULATORY_CARE_PROVIDER_SITE_OTHER): Payer: 59 | Admitting: Family

## 2015-02-08 ENCOUNTER — Encounter: Payer: Self-pay | Admitting: Family

## 2015-02-08 ENCOUNTER — Other Ambulatory Visit (INDEPENDENT_AMBULATORY_CARE_PROVIDER_SITE_OTHER): Payer: 59

## 2015-02-08 VITALS — BP 122/88 | HR 79 | Temp 97.7°F | Resp 18 | Ht 70.0 in | Wt 216.0 lb

## 2015-02-08 DIAGNOSIS — H538 Other visual disturbances: Secondary | ICD-10-CM

## 2015-02-08 DIAGNOSIS — E119 Type 2 diabetes mellitus without complications: Secondary | ICD-10-CM

## 2015-02-08 LAB — CBC WITH DIFFERENTIAL/PLATELET
Basophils Absolute: 0.1 10*3/uL (ref 0.0–0.1)
Basophils Relative: 0.8 % (ref 0.0–3.0)
EOS PCT: 2.6 % (ref 0.0–5.0)
Eosinophils Absolute: 0.2 10*3/uL (ref 0.0–0.7)
HEMATOCRIT: 42.7 % (ref 39.0–52.0)
Hemoglobin: 14.7 g/dL (ref 13.0–17.0)
LYMPHS ABS: 3.2 10*3/uL (ref 0.7–4.0)
Lymphocytes Relative: 35.7 % (ref 12.0–46.0)
MCHC: 34.5 g/dL (ref 30.0–36.0)
MCV: 74.5 fl — ABNORMAL LOW (ref 78.0–100.0)
MONOS PCT: 9.5 % (ref 3.0–12.0)
Monocytes Absolute: 0.9 10*3/uL (ref 0.1–1.0)
NEUTROS ABS: 4.6 10*3/uL (ref 1.4–7.7)
Neutrophils Relative %: 51.4 % (ref 43.0–77.0)
Platelets: 316 10*3/uL (ref 150.0–400.0)
RBC: 5.73 Mil/uL (ref 4.22–5.81)
RDW: 13.7 % (ref 11.5–15.5)
WBC: 9 10*3/uL (ref 4.0–10.5)

## 2015-02-08 LAB — HEMOGLOBIN A1C: HEMOGLOBIN A1C: 6.6 % — AB (ref 4.6–6.5)

## 2015-02-08 NOTE — Progress Notes (Signed)
   Subjective:    Patient ID: Marcus Schultz, male    DOB: Jun 29, 1976, 39 y.o.   MRN: [id number]  Chief Complaint  Patient presents with  . Blurred Vision    x3 days, having pain in his side, when waking up he has red eyes, his vision is blurred and he feels dizzy, dry mouth,     HPI:  Marcus Schultz is a 39 y.o. male with a PMH of Type 2 diabetes, hyogonadism, and low testosterone who presents today for an acute office visit.     1) Blurred vision - This is a new problem. Associated symptom of blurred vision has been going on for about 3 days. States that when he wakes up his eye has been very red. Blood sugars in the morning have been normal. Symptoms are worse during the day. When reading he feels like he sees ants but in reality there is nothing there. Denies any modifying factors that make his vision better or worse. Computer screens exacerbate the blurred vision. Denies vision loss or seeing double. Denies any halos or sensations of curtain closing. States he has been seen by an eye doctor recently.    No Known Allergies   Current Outpatient Prescriptions on File Prior to Visit  Medication Sig Dispense Refill  . amoxicillin-clavulanate (AUGMENTIN) 875-125 MG per tablet TAKE 1 TABLET BY MOUTH TWICE DAILY 20 tablet 0  . SitaGLIPtin-MetFORMIN HCl 50-1000 MG TB24 Take 1 tablet by mouth daily. 90 tablet 3  . tadalafil (CIALIS) 5 MG tablet Take 1 tablet (5 mg total) by mouth daily as needed for erectile dysfunction. 10 tablet 11  . testosterone cypionate (DEPOTESTOTERONE CYPIONATE) 200 MG/ML injection INJECT 1ML INTO THE MUSCLE EVERY 14 DAYS 10 mL 1   No current facility-administered medications on file prior to visit.    Review of Systems  Constitutional: Positive for chills. Negative for fever.  HENT: Positive for sneezing. Negative for ear pain, sinus pressure and sore throat.   Eyes: Positive for discharge, redness and itching. Negative for photophobia.       Positive for blurred  vision.   Neurological: Positive for dizziness and headaches.      Objective:    BP 122/88 mmHg  Pulse 79  Temp(Src) 97.7 F (36.5 C) (Oral)  Resp 18  Ht 5\' 10"  (1.778 m)  Wt 216 lb (97.977 kg)  BMI 30.99 kg/m2  SpO2 95% Nursing note and vital signs reviewed.  Physical Exam  Constitutional: He is oriented to person, place, and time. He appears well-developed and well-nourished. No distress.  Eyes: Conjunctivae, EOM and lids are normal. Pupils are equal, round, and reactive to light. Lids are everted and swept, no foreign bodies found. Right eye exhibits no discharge, no exudate and no hordeolum. No foreign body present in the right eye. Left eye exhibits no discharge, no exudate and no hordeolum. No foreign body present in the left eye.  Cardiovascular: Normal rate, regular rhythm, normal heart sounds and intact distal pulses.   Pulmonary/Chest: Effort normal and breath sounds normal.  Neurological: He is alert and oriented to person, place, and time.  Skin: Skin is warm and dry.  Psychiatric: He has a normal mood and affect. His behavior is normal. Judgment and thought content normal.       Assessment & Plan:

## 2015-02-08 NOTE — Assessment & Plan Note (Signed)
Obtain A1c.

## 2015-02-08 NOTE — Assessment & Plan Note (Signed)
Symptoms and exam most likely consistent with seasonal allergies or possible diabetes related. No evidence of conjunctivitis. Try over the counter second generation antihistamines and nasal corticosteroids. Obtain A1c and CBC to rule out diabetes and infection respectively.

## 2015-02-08 NOTE — Patient Instructions (Addendum)
Thank you for choosing [facility].  Summary/Instructions:   Please stop by the lab on the basement level of the building for your blood work. Your results will be released to [facility] (or called to you) after review, usually within 72 hours after test completion. If any changes need to be made, you will be notified at that same time.  If your symptoms worsen or fail to improve, please contact our office for further instruction, or in case of emergency go directly to the emergency room at the closest medical facility.   Blurred Vision You have been seen today complaining of blurred vision. This means you have a loss of ability to see small details.  CAUSES  Blurred vision can be a symptom of underlying eye problems, such as:  Aging of the eye (presbyopia).  Glaucoma.  Cataracts.  Eye infection.  Eye-related migraine.  Diabetes mellitus.  Fatigue.  Migraine headaches.  High blood pressure.  Breakdown of the back of the eye (macular degeneration).  Problems caused by some medications. The most common cause of blurred vision is the need for eyeglasses or a new prescription. Today in the emergency department, no cause for your blurred vision can be found. SYMPTOMS  Blurred vision is the loss of visual sharpness and detail (acuity). DIAGNOSIS  Should blurred vision continue, you should see your caregiver. If your caregiver is your primary care physician, he or she may choose to refer you to another specialist.  TREATMENT  Do not ignore your blurred vision. Make sure to have it checked out to see if further treatment or referral is necessary. SEEK MEDICAL CARE IF:  You are unable to get into a specialist so we can help you with a referral. SEEK IMMEDIATE MEDICAL CARE IF: You have severe eye pain, severe headache, or sudden loss of vision. MAKE SURE YOU:   Understand these instructions.  Will watch your condition.  Will get help right away if you are not doing well  or get worse. Document Released: 09/25/2003 Document Revised: 12/15/2011 Document Reviewed: 04/26/2008 [facility] Patient Information 2015 [place], [place]. This information is not intended to replace advice given to you by your health care provider. Make sure you discuss any questions you have with your health care provider.

## 2015-02-08 NOTE — Progress Notes (Signed)
Pre visit review using our clinic review tool, if applicable. No additional management support is needed unless otherwise documented below in the visit note. 

## 2015-02-09 ENCOUNTER — Encounter: Payer: Self-pay | Admitting: Family

## 2015-04-15 ENCOUNTER — Other Ambulatory Visit: Payer: Self-pay | Admitting: Internal Medicine

## 2015-05-07 ENCOUNTER — Telehealth: Payer: Self-pay | Admitting: Internal Medicine

## 2015-05-07 NOTE — Telephone Encounter (Signed)
Patient Name: Marcus Schultz DOB: Apr 09, 1976 Initial Comment Caller states has pain in lower abd; moderate (5/10) Nurse Assessment Nurse: Vallery Sa, RN, Cathy Date/Time (Eastern Time): 05/07/2015 1:49:24 PM Confirm and document reason for call. If symptomatic, describe symptoms. ---Caller states states he developed left, lower abdominal pain about 1.5 weeks ago. No injury in the past 3 days. No fever. Has the patient traveled out of the country within the last 30 days? ---No Does the patient require triage? ---Yes Related visit to physician within the last 2 weeks? ---No Does the PT have any chronic conditions? (i.e. diabetes, asthma, etc.) ---Yes List chronic conditions. ---Diabetes Guidelines Guideline Title Affirmed Question Affirmed Notes Abdominal Pain - Male [1] Pain in the scrotum or testicle AND [2] present > 1 hour Final Disposition User Go to ED Now (or PCP triage) Vallery Sa, RN, Tye [place] Comments During triage the caller shared he is also having testicular pain. He declined the go to ER disposition and shared he may see a doctor tomorrow if he is still feeling bad. Called the office and notified Ellison Hughs who will notify MD. Referrals GO TO FACILITY REFUSED Disagree/Comply: Disagree Disagree/Comply Reason: Disagree with instructions

## 2015-05-08 ENCOUNTER — Telehealth: Payer: Self-pay | Admitting: *Deleted

## 2015-05-08 NOTE — Telephone Encounter (Signed)
[facility] Patient Name: Marcus Schultz Gender: Male DOB: 01/05/1976 Age: 39 Y 47 M 5 D Return Phone Number: [phone] (Primary) Address: [address] City/State/Zip: [place] [address] Client [place] [facility] - Client Client Site [place] - Day Physician [place], [place] Type Call Call Type Triage / Clinical Relationship To Patient Self Appointment Disposition EMR Appointment Not Necessary Info pasted into Epic Yes Return Phone Number [phone] (Primary) Chief Complaint Abdominal Pain Initial Comment Caller states has pain in lower abd; moderate (5/10) PreDisposition Call Doctor Nurse Assessment Nurse: Vallery Sa, RN, Tye [place] Date/Time (Eastern Time): 05/07/2015 1:49:24 PM Confirm and document reason for call. If symptomatic, describe symptoms. ---Caller states states he developed left, lower abdominal pain about 1.5 weeks ago. No injury in the past 3 days. No fever. Has the patient traveled out of the country within the last 30 days? ---No Does the patient require triage? ---Yes Related visit to physician within the last 2 weeks? ---No Does the PT have any chronic conditions? (i.e. diabetes, asthma, etc.) ---Yes List chronic conditions. ---Diabetes Guidelines Guideline Title Affirmed Question Affirmed Notes Nurse Date/Time (Eastern Time) Abdominal Pain - Male [1] Pain in the scrotum or testicle AND [2] present > 1 hour [place], RN, Cathy 05/07/2015 1:50:55 PM Disp. Time Eilene Ghazi Time) Disposition Final User 05/07/2015 1:53:35 PM Go to ED Now (or PCP triage) Yes Vallery Sa, RN, Rosey [place] Understands: Yes Disagree/Comply: Disagree Disagree/Comply Reason: Disagree with instructions Care Advice Given Per Guideline PLEASE NOTE: All timestamps contained within this report are represented as [place] Standard Time. CONFIDENTIALTY NOTICE: This fax  transmission is intended only for the addressee. It contains information that is legally privileged, confidential or otherwise protected from use or disclosure. If you are not the intended recipient, you are strictly prohibited from reviewing, disclosing, copying using or disseminating any of this information or taking any action in reliance on or regarding this information. If you have received this fax in error, please notify us immediately by telephone so that we can arrange for its return to [place]. Phone: [phone], Toll-Free: [phone], Fax: [phone] Page: 2 of 2 Call Id: [id number] Care Advice Given Per Guideline GO TO ED NOW (OR PCP TRIAGE): * IF NO PCP TRIAGE: You need to be seen. Go to the [facility] at _[facility] within the next hour. Leave as soon as you can. BRING MEDICINES: * Please bring a list of your current medicines when you go to the Emergency Department (ER). * It is also a good idea to bring the pill bottles too. This will help the doctor to make certain you are taking the right medicines and the right dose. NOTHING BY MOUTH: Do not eat or drink anything for now. (Reason: condition may need surgery and general anesthesia) CARE ADVICE given per Abdominal Pain, Male (Adult) guideline. After Care Instructions Given Call Event Type User Date / Time Description Comments User: Berton Mount, RN Date/Time Eilene Ghazi Time): 05/07/2015 1:57:42 PM During triage the caller shared he is also having testicular pain. He declined the go to ER disposition and shared he may see a doctor tomorrow if he is still feeling bad. Called the office and notified Ellison Hughs who will notify MD. Referrals GO TO FACILITY REFUSED

## 2015-05-10 ENCOUNTER — Encounter: Payer: Self-pay | Admitting: Internal Medicine

## 2015-05-10 ENCOUNTER — Ambulatory Visit (INDEPENDENT_AMBULATORY_CARE_PROVIDER_SITE_OTHER): Payer: 59 | Admitting: Internal Medicine

## 2015-05-10 ENCOUNTER — Other Ambulatory Visit (INDEPENDENT_AMBULATORY_CARE_PROVIDER_SITE_OTHER): Payer: 59

## 2015-05-10 VITALS — BP 140/92 | HR 78 | Temp 97.8°F | Resp 16 | Ht 70.0 in | Wt 209.0 lb

## 2015-05-10 DIAGNOSIS — E119 Type 2 diabetes mellitus without complications: Secondary | ICD-10-CM | POA: Diagnosis not present

## 2015-05-10 DIAGNOSIS — E291 Testicular hypofunction: Secondary | ICD-10-CM | POA: Diagnosis not present

## 2015-05-10 DIAGNOSIS — R748 Abnormal levels of other serum enzymes: Secondary | ICD-10-CM | POA: Diagnosis not present

## 2015-05-10 DIAGNOSIS — R10814 Left lower quadrant abdominal tenderness: Secondary | ICD-10-CM | POA: Diagnosis not present

## 2015-05-10 DIAGNOSIS — R7989 Other specified abnormal findings of blood chemistry: Secondary | ICD-10-CM

## 2015-05-10 LAB — CBC WITH DIFFERENTIAL/PLATELET
Basophils Absolute: 0 10*3/uL (ref 0.0–0.1)
Basophils Relative: 0.5 % (ref 0.0–3.0)
EOS ABS: 0.2 10*3/uL (ref 0.0–0.7)
Eosinophils Relative: 2.2 % (ref 0.0–5.0)
HEMATOCRIT: 46.5 % (ref 39.0–52.0)
Hemoglobin: 15.4 g/dL (ref 13.0–17.0)
Lymphocytes Relative: 31.6 % (ref 12.0–46.0)
Lymphs Abs: 2.5 10*3/uL (ref 0.7–4.0)
MCHC: 33.2 g/dL (ref 30.0–36.0)
MCV: 77.1 fl — AB (ref 78.0–100.0)
Monocytes Absolute: 0.6 10*3/uL (ref 0.1–1.0)
Monocytes Relative: 7.3 % (ref 3.0–12.0)
NEUTROS ABS: 4.6 10*3/uL (ref 1.4–7.7)
Neutrophils Relative %: 58.4 % (ref 43.0–77.0)
PLATELETS: 280 10*3/uL (ref 150.0–400.0)
RBC: 6.02 Mil/uL — ABNORMAL HIGH (ref 4.22–5.81)
RDW: 13.8 % (ref 11.5–15.5)
WBC: 7.8 10*3/uL (ref 4.0–10.5)

## 2015-05-10 LAB — HEMOGLOBIN A1C: HEMOGLOBIN A1C: 6.8 % — AB (ref 4.6–6.5)

## 2015-05-10 LAB — URINALYSIS, ROUTINE W REFLEX MICROSCOPIC
Bilirubin Urine: NEGATIVE
Hgb urine dipstick: NEGATIVE
Ketones, ur: NEGATIVE
LEUKOCYTES UA: NEGATIVE
Nitrite: NEGATIVE
PH: 6.5 (ref 5.0–8.0)
RBC / HPF: NONE SEEN (ref 0–?)
Specific Gravity, Urine: <=1.005 — AB (ref 1.000–1.030)
TOTAL PROTEIN, URINE-UPE24: NEGATIVE
Urine Glucose: NEGATIVE
Urobilinogen, UA: 0.2 (ref 0.0–1.0)

## 2015-05-10 LAB — AMYLASE: Amylase: 56 U/L (ref 27–131)

## 2015-05-10 LAB — COMPREHENSIVE METABOLIC PANEL
ALT: 37 U/L (ref 0–53)
AST: 22 U/L (ref 0–37)
Albumin: 4.4 g/dL (ref 3.5–5.2)
Alkaline Phosphatase: 74 U/L (ref 39–117)
BUN: 12 mg/dL (ref 6–23)
CALCIUM: 9.3 mg/dL (ref 8.4–10.5)
CHLORIDE: 103 meq/L (ref 96–112)
CO2: 28 mEq/L (ref 19–32)
Creatinine, Ser: 0.91 mg/dL (ref 0.40–1.50)
GFR: 98.78 mL/min (ref >60.00)
Glucose, Bld: 122 mg/dL — ABNORMAL HIGH (ref 70–99)
Potassium: 4 mEq/L (ref 3.5–5.1)
Sodium: 139 mEq/L (ref 135–145)
Total Bilirubin: 0.3 mg/dL (ref 0.2–1.2)
Total Protein: 7.3 g/dL (ref 6.0–8.3)

## 2015-05-10 LAB — C-REACTIVE PROTEIN: CRP: 0.2 mg/dL — ABNORMAL LOW (ref 0.5–20.0)

## 2015-05-10 LAB — LIPASE: Lipase: 79 U/L — ABNORMAL HIGH (ref 11.0–59.0)

## 2015-05-10 LAB — FECAL OCCULT BLOOD, GUAIAC: Fecal Occult Blood: NEGATIVE

## 2015-05-10 MED ORDER — TESTOSTERONE CYPIONATE 200 MG/ML IM SOLN: INTRAMUSCULAR | Status: DC

## 2015-05-10 NOTE — Progress Notes (Signed)
Subjective:  Patient ID: Marcus Schultz, male    DOB: June 02, 1976  Age: 39 y.o. MRN: [id number]  CC: Abdominal Pain   HPI Marcus Schultz presents for evaluation of intermittent left lower quadrant pain that he describes as a sharp, pressure sensation that radiates towards his left testicle. He says the pain is 4-5 on a scale of 10. He has not taken anything for pain.  Outpatient Prescriptions Prior to Visit  Medication Sig Dispense Refill  . JANUMET XR 50-1000 MG TB24 TAKE 1 TABLET BY MOUTH DAILY 90 tablet 0  . tadalafil (CIALIS) 5 MG tablet Take 1 tablet (5 mg total) by mouth daily as needed for erectile dysfunction. 10 tablet 11  . testosterone cypionate (DEPOTESTOTERONE CYPIONATE) 200 MG/ML injection INJECT 1ML INTO THE MUSCLE EVERY 14 DAYS 10 mL 1   No facility-administered medications prior to visit.    ROS Review of Systems  Constitutional: Negative.  Negative for fever, chills, diaphoresis, appetite change and fatigue.  HENT: Negative.  Negative for trouble swallowing and voice change.   Eyes: Negative.   Respiratory: Negative.  Negative for cough, choking, chest tightness, shortness of breath and stridor.   Cardiovascular: Negative.  Negative for chest pain, palpitations and leg swelling.  Gastrointestinal: Positive for abdominal pain. Negative for nausea, vomiting, diarrhea, constipation, blood in stool, abdominal distention and anal bleeding.  Endocrine: Negative.   Genitourinary: Positive for flank pain. Negative for dysuria, urgency, frequency, hematuria, decreased urine volume, penile swelling, scrotal swelling, enuresis, difficulty urinating, penile pain and testicular pain.  Musculoskeletal: Negative.   Skin: Negative.   Allergic/Immunologic: Negative.   Neurological: Negative.   Hematological: Negative.  Negative for adenopathy. Does not bruise/bleed easily.  Psychiatric/Behavioral: Negative.     Objective:  BP 140/92 mmHg  Pulse 78  Temp(Src) 97.8 F (36.6 C)  (Oral)  Resp 16  Ht 5\' 10"  (1.778 m)  Wt 209 lb (94.802 kg)  BMI 29.99 kg/m2  SpO2 97%  BP Readings from Last 3 Encounters:  05/10/15 140/92  02/08/15 122/88  09/20/14 130/90    Wt Readings from Last 3 Encounters:  05/10/15 209 lb (94.802 kg)  02/08/15 216 lb (97.977 kg)  09/20/14 212 lb (96.163 kg)    Physical Exam  Constitutional: He is oriented to person, place, and time. He appears well-developed and well-nourished.  Non-toxic appearance. He does not have a sickly appearance. He does not appear ill. No distress.  HENT:  Mouth/Throat: Oropharynx is clear and moist. No oropharyngeal exudate.  Eyes: Conjunctivae are normal. Right eye exhibits no discharge. Left eye exhibits no discharge. No scleral icterus.  Neck: Normal range of motion. Neck supple. No JVD present. No tracheal deviation present. No thyromegaly present.  Cardiovascular: Normal rate, regular rhythm, normal heart sounds and intact distal pulses.  Exam reveals no gallop and no friction rub.   No murmur heard. Pulmonary/Chest: Effort normal and breath sounds normal. No stridor. No respiratory distress. He has no wheezes. He has no rales. He exhibits no tenderness.  Abdominal: Soft. Normal appearance and bowel sounds are normal. He exhibits no shifting dullness, no distension, no pulsatile liver, no fluid wave, no abdominal bruit, no ascites, no pulsatile midline mass and no mass. There is no hepatosplenomegaly, splenomegaly or hepatomegaly. There is tenderness in the left lower quadrant. There is no rigidity, no rebound, no guarding, no CVA tenderness, no tenderness at McBurney's point and negative Murphy's sign. No hernia. Hernia confirmed negative in the ventral area, confirmed negative in the right inguinal area  and confirmed negative in the left inguinal area.  Genitourinary: Rectum normal, prostate normal, testes normal and penis normal. Rectal exam shows no external hemorrhoid, no internal hemorrhoid, no fissure, no  mass, no tenderness and anal tone normal. Guaiac negative stool. Prostate is not enlarged and not tender. Right testis shows no mass, no swelling and no tenderness. Right testis is descended. Left testis shows no mass, no swelling and no tenderness. Left testis is descended. Circumcised. No penile erythema or penile tenderness. No discharge found.  Musculoskeletal: Normal range of motion. He exhibits no edema or tenderness.  Lymphadenopathy:    He has no cervical adenopathy.       Right: No inguinal adenopathy present.       Left: No inguinal adenopathy present.  Neurological: He is oriented to person, place, and time.  Skin: Skin is warm and dry. No rash noted. He is not diaphoretic. No erythema. No pallor.  Psychiatric: He has a normal mood and affect. His behavior is normal. Judgment and thought content normal.    Lab Results  Component Value Date   WBC 7.8 05/10/2015   HGB 15.4 05/10/2015   HCT 46.5 05/10/2015   PLT 280.0 05/10/2015   GLUCOSE 122* 05/10/2015   CHOL 197 09/20/2014   TRIG 170.0* 09/20/2014   HDL 30.40* 09/20/2014   LDLDIRECT 136.1 10/13/2013   LDLCALC 133* 09/20/2014   ALT 37 05/10/2015   AST 22 05/10/2015   NA 139 05/10/2015   K 4.0 05/10/2015   CL 103 05/10/2015   CREATININE 0.91 05/10/2015   BUN 12 05/10/2015   CO2 28 05/10/2015   TSH 1.22 09/20/2014   PSA 0.91 10/13/2013   HGBA1C 6.8* 05/10/2015   MICROALBUR 0.1 09/20/2014    Dg Cervical Spine Complete  09/20/2014   CLINICAL DATA:  Neck pain for 2 weeks without trauma.  EXAM: CERVICAL SPINE  4+ VIEWS  COMPARISON:  None.  FINDINGS: The lateral view images through the top of T1. Prevertebral soft tissues are within normal limits. Maintenance of vertebral body height and alignment. Minimal endplate osteophyte formation at C6-7. Bilateral neural foraminal narrowing at C6-7 secondary to uncovertebral joint hypertrophy. Lateral masses and odontoid process intact.  IMPRESSION: Mild spondylosis at C6-7, resulting  in bilateral neural foraminal narrowing.   Electronically Signed   By: Abigail Miyamoto M.D.   On: 09/20/2014 14:29    Assessment & Plan:   Marcus Schultz was seen today for abdominal pain.  Diagnoses and all orders for this visit:  Diabetes mellitus type 2 in nonobese- his blood sugars are well-controlled. Renal function is stable. Orders: -     Hemoglobin A1c; Future  Abdominal tenderness of left lower quadrant- his abdominal exam is positive for some tenderness but there is no rebound or guarding. All of his labs are normal with the exception of a mildly elevated lipase. Will get a CT scan done to see if he has pancreatitis or a disturbed abdominal viscus. Orders: -     Lipase; Future -     Amylase; Future -     Comprehensive metabolic panel; Future -     CBC with Differential/Platelet; Future -     Urinalysis, Routine w reflex microscopic (not at [facility]); Future -     C-reactive protein; Future -     CT Abdomen Pelvis W Contrast; Future  Low testosterone Orders: -     testosterone cypionate (DEPOTESTOSTERONE CYPIONATE) 200 MG/ML injection; INJECT 1ML INTO THE MUSCLE EVERY 108 DAYS  Hypogonadism male Orders: -  testosterone cypionate (DEPOTESTOSTERONE CYPIONATE) 200 MG/ML injection; INJECT 1ML INTO THE MUSCLE EVERY 14 DAYS  Elevated lipase- he has left lower quadrant tenderness and an isolated mild elevation in his lipase. I've ordered a stat CT scan to look for pathology in the abdomen. Orders: -     CT Abdomen Pelvis W Contrast; Future  I have changed Marcus Schultz's testosterone cypionate. I am also having him maintain his tadalafil and JANUMET XR.  Meds ordered this encounter  Medications  . testosterone cypionate (DEPOTESTOSTERONE CYPIONATE) 200 MG/ML injection    Sig: INJECT 1ML INTO THE MUSCLE EVERY 14 DAYS    Dispense:  10 mL    Refill:  1    This request is for a new prescription for a controlled substance as required by Federal/State law.     Follow-up: Return in about 3  weeks (around 05/31/2015).  Scarlette Calico, MD

## 2015-05-10 NOTE — Patient Instructions (Signed)

## 2015-05-10 NOTE — Progress Notes (Signed)
Pre visit review using our clinic review tool, if applicable. No additional management support is needed unless otherwise documented below in the visit note. 

## 2015-05-22 ENCOUNTER — Ambulatory Visit (INDEPENDENT_AMBULATORY_CARE_PROVIDER_SITE_OTHER)
Admission: RE | Admit: 2015-05-22 | Discharge: 2015-05-22 | Disposition: A | Discharge status: Home / Self Care | Payer: 59 | Source: Ambulatory Visit | Attending: Internal Medicine | Admitting: Internal Medicine

## 2015-05-22 ENCOUNTER — Encounter: Payer: Self-pay | Admitting: Internal Medicine

## 2015-05-22 DIAGNOSIS — R748 Abnormal levels of other serum enzymes: Secondary | ICD-10-CM | POA: Diagnosis not present

## 2015-05-22 DIAGNOSIS — R10814 Left lower quadrant abdominal tenderness: Secondary | ICD-10-CM

## 2015-05-22 MED ORDER — IOHEXOL 300 MG/ML  SOLN
100.0000 mL | Freq: Once | INTRAMUSCULAR | Status: AC | PRN
  Administered 2015-05-22: 100 mL via INTRAVENOUS

## 2015-07-04 ENCOUNTER — Other Ambulatory Visit (INDEPENDENT_AMBULATORY_CARE_PROVIDER_SITE_OTHER): Payer: 59

## 2015-07-04 ENCOUNTER — Ambulatory Visit (INDEPENDENT_AMBULATORY_CARE_PROVIDER_SITE_OTHER): Payer: 59 | Admitting: Internal Medicine

## 2015-07-04 ENCOUNTER — Encounter: Payer: Self-pay | Admitting: Internal Medicine

## 2015-07-04 VITALS — BP 120/76 | HR 69 | Temp 97.9°F | Resp 16 | Wt 220.0 lb

## 2015-07-04 DIAGNOSIS — R6883 Chills (without fever): Secondary | ICD-10-CM | POA: Diagnosis not present

## 2015-07-04 DIAGNOSIS — L500 Allergic urticaria: Secondary | ICD-10-CM

## 2015-07-04 LAB — CBC WITH DIFFERENTIAL/PLATELET
BASOS ABS: 0 10*3/uL (ref 0.0–0.1)
Basophils Relative: 0.3 % (ref 0.0–3.0)
Eosinophils Absolute: 0.1 10*3/uL (ref 0.0–0.7)
Eosinophils Relative: 1.4 % (ref 0.0–5.0)
HCT: 44.8 % (ref 39.0–52.0)
Hemoglobin: 14.7 g/dL (ref 13.0–17.0)
LYMPHS ABS: 2.6 10*3/uL (ref 0.7–4.0)
Lymphocytes Relative: 29.7 % (ref 12.0–46.0)
MCHC: 32.8 g/dL (ref 30.0–36.0)
MCV: 77.6 fl — AB (ref 78.0–100.0)
MONO ABS: 0.5 10*3/uL (ref 0.1–1.0)
Monocytes Relative: 6.2 % (ref 3.0–12.0)
NEUTROS ABS: 5.4 10*3/uL (ref 1.4–7.7)
NEUTROS PCT: 62.4 % (ref 43.0–77.0)
PLATELETS: 315 10*3/uL (ref 150.0–400.0)
RBC: 5.77 Mil/uL (ref 4.22–5.81)
RDW: 14.5 % (ref 11.5–15.5)
WBC: 8.7 10*3/uL (ref 4.0–10.5)

## 2015-07-04 MED ORDER — RANITIDINE HCL 150 MG PO TABS: 150.0000 mg | ORAL_TABLET | Freq: Two times a day (BID) | ORAL | Status: AC

## 2015-07-04 MED ORDER — HYDROXYZINE HCL 10 MG PO TABS: 10.0000 mg | ORAL_TABLET | Freq: Three times a day (TID) | ORAL | Status: DC | PRN

## 2015-07-04 MED ORDER — PREDNISONE 20 MG PO TABS: 20.0000 mg | ORAL_TABLET | Freq: Two times a day (BID) | ORAL | Status: DC

## 2015-07-04 NOTE — Progress Notes (Signed)
Pre visit review using our clinic review tool, if applicable. No additional management support is needed unless otherwise documented below in the visit note. 

## 2015-07-04 NOTE — Progress Notes (Signed)
   Subjective:    Patient ID: Marcus Schultz, male    DOB: [birth year], 39 y.o.   MRN: [id number]  HPI  He began to have diffuse itching 9/26/1 16 associated with diffuse erythema. There was no specific food or chemical trigger/ exposure. On 9/27 he did have some chills. He also noted some edema of his hands and feet. This morning he had redness over the face. He's been using a topical anti-itch cream as well as Zyrtec with some benefit.  Diabetes has been well controlled; all sugars are less than 150. His most recent A1c was 6.8% on 05/10/15.   Review of Systems  No associated itchy, watery eyes.  Swelling of the lips or tongue or intraoral lesions denied.  Shortness of breath, wheezing, or cough absent.  Fever or sweats denied.   Diarrhea not present.  No dysuria, pyuria or hematuria.     Objective:   Physical Exam Pertinent or positive findings include: Geographic rash with some papular character circumferentially around the lower neck and upper chest. This does blanch with pressure. He has similar smaller lesions in the antecubital areas. Mild dermatographia is elicitable.  General appearance :adequately nourished; in no distress.  Eyes: No conjunctival inflammation or scleral icterus is present.  Oral exam:  Lips and gums are healthy appearing.There is no oropharyngeal erythema or exudate noted. Dental hygiene is good.  Heart:  Normal rate and regular rhythm. S1 and S2 normal without gallop, murmur, click, rub or other extra sounds    Lungs:Chest clear to auscultation; no wheezes, rhonchi,rales ,or rubs present.No increased work of breathing.   Abdomen: bowel sounds normal, soft and non-tender without masses, organomegaly or hernias noted.  No guarding or rebound.   Vascular : all pulses equal ; no bruits present.  Skin:Warm & dry.  Intact without suspicious lesions or rashes ; no tenting   Lymphatic: No lymphadenopathy is noted about the head, neck, axilla.   Neuro:  Strength, tone  normal.      Assessment & Plan:  #1 urticarial dermatitis  #2 diabetes well controlled  Plan: See orders and recommendations

## 2015-07-04 NOTE — Patient Instructions (Addendum)
Hydroxyzine may cause drowsiness or affect balance ;take this  @ home through the day while out of work today. Do not take hydroxyzine and Zyrtec together.  Take Allegra 180 in the morning when you return to work in the morning; this is nonsedating.You can continue hydroxyzine in the evening @ home. Avoid soaps and cosmetics which are not hypoallergenic. Restrict hyperallergenic foods at this time: Nuts, strawberries, seafood , chocolate, and tomatoes. Take the ranitidine 30 minutes before breakfast and evening meal . This blocks a histamine pathway as we discussed.   if you have any skin eruption; please document any dietary , medicinal, chemical or environmental exposures in the previous 8-12 hours.

## 2015-07-19 ENCOUNTER — Encounter: Payer: Self-pay | Admitting: Internal Medicine

## 2015-07-19 ENCOUNTER — Ambulatory Visit (INDEPENDENT_AMBULATORY_CARE_PROVIDER_SITE_OTHER): Payer: 59 | Admitting: Internal Medicine

## 2015-07-19 ENCOUNTER — Ambulatory Visit (INDEPENDENT_AMBULATORY_CARE_PROVIDER_SITE_OTHER)
Admission: RE | Admit: 2015-07-19 | Discharge: 2015-07-19 | Disposition: A | Discharge status: Home / Self Care | Payer: 59 | Source: Ambulatory Visit | Attending: Internal Medicine | Admitting: Internal Medicine

## 2015-07-19 VITALS — BP 118/82 | HR 101 | Temp 98.3°F | Ht 70.0 in | Wt 215.0 lb

## 2015-07-19 DIAGNOSIS — R05 Cough: Secondary | ICD-10-CM

## 2015-07-19 DIAGNOSIS — R509 Fever, unspecified: Secondary | ICD-10-CM

## 2015-07-19 DIAGNOSIS — R059 Cough, unspecified: Secondary | ICD-10-CM

## 2015-07-19 DIAGNOSIS — E119 Type 2 diabetes mellitus without complications: Secondary | ICD-10-CM | POA: Diagnosis not present

## 2015-07-19 MED ORDER — LEVOFLOXACIN 500 MG PO TABS: 500.0000 mg | ORAL_TABLET | Freq: Every day | ORAL | Status: DC

## 2015-07-19 MED ORDER — HYDROCODONE-HOMATROPINE 5-1.5 MG/5ML PO SYRP: 5.0000 mL | ORAL_SOLUTION | Freq: Four times a day (QID) | ORAL | Status: DC | PRN

## 2015-07-19 NOTE — Assessment & Plan Note (Signed)
stable overall by history and exam, recent data reviewed with pt, and pt to continue medical treatment as before,  to f/u any worsening symptoms or concerns Lab Results  Component Value Date   HGBA1C 6.8* 05/10/2015   Pt to call for onset polys with illness

## 2015-07-19 NOTE — Progress Notes (Signed)
   Subjective:    Patient ID: Marcus Schultz, male    DOB: [birth year], 39 y.o.   MRN: [id number]  HPI  Here with acute onset mild to mod 2-3 days ST, HA, general weakness and malaise, with prod cough greenish sputum, but Pt denies chest pain, increased sob or doe, wheezing, orthopnea, PND, increased LE swelling, palpitations, dizziness or syncope.  Temp at home 103 per pt.  Some wheezing but no sob.  Some low mid sternal pressure with cough only.   Pt denies polydipsia, polyuria, or low sugar episode.   Pt denies new neurological symptoms such as new headache  Past Medical History  Diagnosis Date  . Allergy   . Diabetes mellitus without complication [facility])    Past Surgical History  Procedure Laterality Date  . Tonsillectomy    . Colonoscopy w/ polypectomy  02/17/2011    two small polyps remved    reports that he has never smoked. He has never used smokeless tobacco. He reports that he does not drink alcohol or use illicit drugs. family history includes Colon cancer in his maternal aunt and other; Colon cancer (age of onset: 43) in his father; Diabetes in his mother; Hypertension in his mother. There is no history of Birth defects, Cancer, Alcohol abuse, Early death, Hearing loss, Heart disease, Hyperlipidemia, Kidney disease, or Stroke. No Known Allergies Current Outpatient Prescriptions on File Prior to Visit  Medication Sig Dispense Refill  . JANUMET XR 50-1000 MG TB24 TAKE 1 TABLET BY MOUTH DAILY 90 tablet 0  . ranitidine (ZANTAC) 150 MG tablet Take 1 tablet (150 mg total) by mouth 2 (two) times daily. 60 tablet 0  . tadalafil (CIALIS) 5 MG tablet Take 1 tablet (5 mg total) by mouth daily as needed for erectile dysfunction. 10 tablet 11  . testosterone cypionate (DEPOTESTOSTERONE CYPIONATE) 200 MG/ML injection INJECT 1ML INTO THE MUSCLE EVERY 14 DAYS 10 mL 1   No current facility-administered medications on file prior to visit.     Review of Systems  Constitutional: Negative for unusual  diaphoresis or night sweats HENT: Negative for ringing in ear or discharge Eyes: Negative for double vision or worsening visual disturbance.  Respiratory: Negative for choking and stridor.   Gastrointestinal: Negative for vomiting or other signifcant bowel change Genitourinary: Negative for hematuria or change in urine volume.  Musculoskeletal: Negative for other MSK pain or swelling Skin: Negative for color change and worsening wound.  Neurological: Negative for tremors and numbness other than noted  Psychiatric/Behavioral: Negative for decreased concentration or agitation other than above       Objective:   Physical Exam BP 118/82 mmHg  Pulse 101  Temp(Src) 98.3 F (36.8 C) (Oral)  Ht 5\' 10"  (1.778 m)  Wt 215 lb (97.523 kg)  BMI 30.85 kg/m2  SpO2 97% VS noted, mild ill Constitutional: Pt appears in no significant distress HENT: Head: NCAT.  Right Ear: External ear normal.  Left Ear: External ear normal.  Eyes: . Pupils are equal, round, and reactive to light. Conjunctivae and EOM are normal Neck: Normal range of motion. Neck supple.  Cardiovascular: Normal rate and regular rhythm.   Pulmonary/Chest: Effort normal and breath sounds without rales or wheezing.  Neurological: Pt is alert. Not confused , motor grossly intact Skin: Skin is warm. No rash, no LE edema Psychiatric: Pt behavior is normal. No agitation.     Assessment & Plan:

## 2015-07-19 NOTE — Patient Instructions (Signed)
Please take all new medication as prescribed - the antibiotic, and cough medicine if needed  Please continue all other medications as before, and refills have been done if requested.  Please have the pharmacy call with any other refills you may need.  Please continue your efforts at being more active, low cholesterol diet, and weight control.  Please keep your appointments with your specialists as you may have planned  Please go to the XRAY Department in the Basement (go straight as you get off the elevator) for the x-ray testing  You will be contacted by phone if any changes need to be made immediately.  Otherwise, you will receive a letter about your results with an explanation, but please check with MyChart first.  Please remember to sign up for MyChart if you have not done so, as this will be important to you in the future with finding out test results, communicating by private email, and scheduling acute appointments online when needed.

## 2015-07-19 NOTE — Progress Notes (Signed)
Pre visit review using our clinic review tool, if applicable. No additional management support is needed unless otherwise documented below in the visit note. 

## 2015-07-19 NOTE — Assessment & Plan Note (Signed)
Mod to severe, with prod cough and subjective wheezing though none on exam, pt nontoxic but is mildly tachycardic, with report of very high temp, son ill with cough in last few days as well - all most c/w acute bronchitis, though cant r/o pna- for levaquin, cough med prn,  to f/u any worsening symptoms or concerns

## 2015-07-19 NOTE — Assessment & Plan Note (Signed)
Seems reliable with temp 103 by report last pm, exam o/w benign, for cxr as well

## 2015-08-19 ENCOUNTER — Other Ambulatory Visit: Payer: Self-pay | Admitting: Internal Medicine

## 2015-08-20 NOTE — Telephone Encounter (Signed)
axed

## 2015-08-28 ENCOUNTER — Telehealth: Payer: Self-pay | Admitting: Internal Medicine

## 2015-08-28 NOTE — Telephone Encounter (Signed)
Pt requesting prescription for JANUMET XR 50-1000 MG TB24 [id number] Pharmacy is [facility] on [address]

## 2015-08-29 ENCOUNTER — Other Ambulatory Visit: Payer: Self-pay

## 2015-08-29 MED ORDER — SITAGLIP PHOS-METFORMIN HCL ER 50-1000 MG PO TB24: 1.0000 | ORAL_TABLET | Freq: Every day | ORAL | Status: DC

## 2015-08-29 NOTE — Telephone Encounter (Signed)
Done

## 2015-09-01 ENCOUNTER — Telehealth: Payer: Self-pay

## 2015-09-01 NOTE — Telephone Encounter (Signed)
Approved pharmacy notified

## 2015-09-01 NOTE — Telephone Encounter (Signed)
covermymeds KEY: [id number]

## 2015-09-10 ENCOUNTER — Encounter: Payer: Self-pay | Admitting: Internal Medicine

## 2015-09-10 ENCOUNTER — Ambulatory Visit (INDEPENDENT_AMBULATORY_CARE_PROVIDER_SITE_OTHER)
Admission: RE | Admit: 2015-09-10 | Discharge: 2015-09-10 | Disposition: A | Discharge status: Home / Self Care | Payer: 59 | Source: Ambulatory Visit | Attending: Internal Medicine | Admitting: Internal Medicine

## 2015-09-10 ENCOUNTER — Other Ambulatory Visit (INDEPENDENT_AMBULATORY_CARE_PROVIDER_SITE_OTHER): Payer: 59

## 2015-09-10 ENCOUNTER — Other Ambulatory Visit: Payer: 59

## 2015-09-10 ENCOUNTER — Ambulatory Visit (INDEPENDENT_AMBULATORY_CARE_PROVIDER_SITE_OTHER): Payer: 59 | Admitting: Internal Medicine

## 2015-09-10 VITALS — BP 138/88 | HR 72 | Temp 98.0°F | Resp 20 | Ht 70.0 in | Wt 212.4 lb

## 2015-09-10 DIAGNOSIS — E785 Hyperlipidemia, unspecified: Secondary | ICD-10-CM | POA: Diagnosis not present

## 2015-09-10 DIAGNOSIS — E119 Type 2 diabetes mellitus without complications: Secondary | ICD-10-CM

## 2015-09-10 DIAGNOSIS — N410 Acute prostatitis: Secondary | ICD-10-CM | POA: Insufficient documentation

## 2015-09-10 DIAGNOSIS — R072 Precordial pain: Secondary | ICD-10-CM

## 2015-09-10 DIAGNOSIS — R300 Dysuria: Secondary | ICD-10-CM | POA: Diagnosis not present

## 2015-09-10 DIAGNOSIS — Z23 Encounter for immunization: Secondary | ICD-10-CM | POA: Diagnosis not present

## 2015-09-10 LAB — URINALYSIS, ROUTINE W REFLEX MICROSCOPIC
BILIRUBIN URINE: NEGATIVE
HGB URINE DIPSTICK: NEGATIVE
Ketones, ur: NEGATIVE
LEUKOCYTES UA: NEGATIVE
Nitrite: NEGATIVE
RBC / HPF: NONE SEEN (ref 0–?)
Specific Gravity, Urine: <=1.005 — AB (ref 1.000–1.030)
TOTAL PROTEIN, URINE-UPE24: NEGATIVE
UROBILINOGEN UA: 0.2 (ref 0.0–1.0)
Urine Glucose: NEGATIVE
WBC UA: NONE SEEN (ref 0–?)
pH: 6.5 (ref 5.0–8.0)

## 2015-09-10 LAB — MICROALBUMIN / CREATININE URINE RATIO
Creatinine,U: 25 mg/dL
Microalb Creat Ratio: 2.8 mg/g (ref 0.0–30.0)
Microalb, Ur: <0.7 mg/dL (ref 0.0–1.9)

## 2015-09-10 LAB — LIPID PANEL
CHOL/HDL RATIO: 6
CHOLESTEROL: 171 mg/dL (ref 0–200)
HDL: 28.9 mg/dL — AB (ref >39.00)
LDL CALC: 111 mg/dL — AB (ref 0–99)
NonHDL: 142.43
TRIGLYCERIDES: 157 mg/dL — AB (ref 0.0–149.0)
VLDL: 31.4 mg/dL (ref 0.0–40.0)

## 2015-09-10 LAB — BASIC METABOLIC PANEL
BUN: 12 mg/dL (ref 6–23)
CHLORIDE: 104 meq/L (ref 96–112)
CO2: 27 meq/L (ref 19–32)
Calcium: 9.1 mg/dL (ref 8.4–10.5)
Creatinine, Ser: 0.85 mg/dL (ref 0.40–1.50)
GFR: 106.69 mL/min (ref >60.00)
Glucose, Bld: 124 mg/dL — ABNORMAL HIGH (ref 70–99)
POTASSIUM: 4.6 meq/L (ref 3.5–5.1)
SODIUM: 140 meq/L (ref 135–145)

## 2015-09-10 LAB — BRAIN NATRIURETIC PEPTIDE: PRO B NATRI PEPTIDE: 7 pg/mL (ref 0.0–100.0)

## 2015-09-10 LAB — TSH: TSH: 1.5 u[IU]/mL (ref 0.35–4.50)

## 2015-09-10 LAB — TROPONIN I: TNIDX: 0 ug/L (ref 0.00–0.06)

## 2015-09-10 LAB — HEMOGLOBIN A1C: Hgb A1c MFr Bld: 6.5 % (ref 4.6–6.5)

## 2015-09-10 MED ORDER — DOXYCYCLINE HYCLATE 100 MG PO TBEC: 100.0000 mg | DELAYED_RELEASE_TABLET | Freq: Two times a day (BID) | ORAL | Status: DC

## 2015-09-10 NOTE — Patient Instructions (Signed)
Nonspecific Chest Pain  °Chest pain can be caused by many different conditions. There is always a chance that your pain could be related to something serious, such as a heart attack or a blood clot in your lungs. Chest pain can also be caused by conditions that are not life-threatening. If you have chest pain, it is very important to follow up with your health care provider. °CAUSES  °Chest pain can be caused by: °· Heartburn. °· Pneumonia or bronchitis. °· Anxiety or stress. °· Inflammation around your heart (pericarditis) or lung (pleuritis or pleurisy). °· A blood clot in your lung. °· A collapsed lung (pneumothorax). It can develop suddenly on its own (spontaneous pneumothorax) or from trauma to the chest. °· Shingles infection (varicella-zoster virus). °· Heart attack. °· Damage to the bones, muscles, and cartilage that make up your chest wall. This can include: °¨ Bruised bones due to injury. °¨ Strained muscles or cartilage due to frequent or repeated coughing or overwork. °¨ Fracture to one or more ribs. °¨ Sore cartilage due to inflammation (costochondritis). °RISK FACTORS  °Risk factors for chest pain may include: °· Activities that increase your risk for trauma or injury to your chest. °· Respiratory infections or conditions that cause frequent coughing. °· Medical conditions or overeating that can cause heartburn. °· Heart disease or family history of heart disease. °· Conditions or health behaviors that increase your risk of developing a blood clot. °· Having had chicken pox (varicella zoster). °SIGNS AND SYMPTOMS °Chest pain can feel like: °· Burning or tingling on the surface of your chest or deep in your chest. °· Crushing, pressure, aching, or squeezing pain. °· Dull or sharp pain that is worse when you move, cough, or take a deep breath. °· Pain that is also felt in your back, neck, shoulder, or arm, or pain that spreads to any of these areas. °Your chest pain may come and go, or it may stay  constant. °DIAGNOSIS °Lab tests or other studies may be needed to find the cause of your pain. Your health care provider may have you take a test called an ambulatory ECG (electrocardiogram). An ECG records your heartbeat patterns at the time the test is performed. You may also have other tests, such as: °· Transthoracic echocardiogram (TTE). During echocardiography, sound waves are used to create a picture of all of the heart structures and to look at how blood flows through your heart. °· Transesophageal echocardiogram (TEE). This is a more advanced imaging test that obtains images from inside your body. It allows your health care provider to see your heart in finer detail. °· Cardiac monitoring. This allows your health care provider to monitor your heart rate and rhythm in real time. °· Holter monitor. This is a portable device that records your heartbeat and can help to diagnose abnormal heartbeats. It allows your health care provider to track your heart activity for several days, if needed. °· Stress tests. These can be done through exercise or by taking medicine that makes your heart beat more quickly. °· Blood tests. °· Imaging tests. °TREATMENT  °Your treatment depends on what is causing your chest pain. Treatment may include: °· Medicines. These may include: °¨ Acid blockers for heartburn. °¨ Anti-inflammatory medicine. °¨ Pain medicine for inflammatory conditions. °¨ Antibiotic medicine, if an infection is present. °¨ Medicines to dissolve blood clots. °¨ Medicines to treat coronary artery disease. °· Supportive care for conditions that do not require medicines. This may include: °¨ Resting. °¨ Applying heat   or cold packs to injured areas. °¨ Limiting activities until pain decreases. °HOME CARE INSTRUCTIONS °· If you were prescribed an antibiotic medicine, finish it all even if you start to feel better. °· Avoid any activities that bring on chest pain. °· Do not use any tobacco products, including  cigarettes, chewing tobacco, or electronic cigarettes. If you need help quitting, ask your health care provider. °· Do not drink alcohol. °· Take medicines only as directed by your health care provider. °· Keep all follow-up visits as directed by your health care provider. This is important. This includes any further testing if your chest pain does not go away. °· If heartburn is the cause for your chest pain, you may be told to keep your head raised (elevated) while sleeping. This reduces the chance that acid will go from your stomach into your esophagus. °· Make lifestyle changes as directed by your health care provider. These may include: °¨ Getting regular exercise. Ask your health care provider to suggest some activities that are safe for you. °¨ Eating a heart-healthy diet. A registered dietitian can help you to learn healthy eating options. °¨ Maintaining a healthy weight. °¨ Managing diabetes, if necessary. °¨ Reducing stress. °SEEK MEDICAL CARE IF: °· Your chest pain does not go away after treatment. °· You have a rash with blisters on your chest. °· You have a fever. °SEEK IMMEDIATE MEDICAL CARE IF:  °· Your chest pain is worse. °· You have an increasing cough, or you cough up blood. °· You have severe abdominal pain. °· You have severe weakness. °· You faint. °· You have chills. °· You have sudden, unexplained chest discomfort. °· You have sudden, unexplained discomfort in your arms, back, neck, or jaw. °· You have shortness of breath at any time. °· You suddenly start to sweat, or your skin gets clammy. °· You feel nauseous or you vomit. °· You suddenly feel light-headed or dizzy. °· Your heart begins to beat quickly, or it feels like it is skipping beats. °These symptoms may represent a serious problem that is an emergency. Do not wait to see if the symptoms will go away. Get medical help right away. Call your local emergency services (911 in the [place] Do not drive yourself to the hospital. °  °This  information is not intended to replace advice given to you by your health care provider. Make sure you discuss any questions you have with your health care provider. °  °Document Released: 07/02/2005 Document Revised: 10/13/2014 Document Reviewed: 04/28/2014 °Elsevier Interactive Patient Education ©2016 [employer] ° °

## 2015-09-10 NOTE — Progress Notes (Signed)
Subjective:  Patient ID: Marcus Schultz, male    DOB: 02/01/1976  Age: 39 y.o. MRN: [id number]  CC: Chest Pain and Dysuria   HPI Yashas Dun presents for a one-week history of pressure on the left side of his chest. He describes the discomfort as occurring when he is laying down and rolls on his left side. He denies dyspnea on exertion or cough. He complains of a few episodes of chills. He denies fever. He also complains of a several week history of dysuria and urinary frequency.  Outpatient Prescriptions Prior to Visit  Medication Sig Dispense Refill  . ranitidine (ZANTAC) 150 MG tablet Take 1 tablet (150 mg total) by mouth 2 (two) times daily. 60 tablet 0  . SitaGLIPtin-MetFORMIN HCl (JANUMET XR) 50-1000 MG TB24 Take 1 tablet by mouth daily. 90 tablet 1  . tadalafil (CIALIS) 5 MG tablet Take 1 tablet (5 mg total) by mouth daily as needed for erectile dysfunction. 10 tablet 11  . testosterone cypionate (DEPOTESTOSTERONE CYPIONATE) 200 MG/ML injection INJECT 1 ML IN THE MUSCLE EVERY 14 DAYS 10 mL 0  . HYDROcodone-homatropine (HYCODAN) 5-1.5 MG/5ML syrup Take 5 mLs by mouth every 6 (six) hours as needed for cough. 180 mL 0  . levofloxacin (LEVAQUIN) 500 MG tablet Take 1 tablet (500 mg total) by mouth daily. 10 tablet 0   No facility-administered medications prior to visit.    ROS Review of Systems  Constitutional: Positive for chills. Negative for fever, diaphoresis, activity change, appetite change, fatigue and unexpected weight change.  HENT: Negative.  Negative for sore throat.   Eyes: Negative.   Respiratory: Negative.  Negative for cough, choking, chest tightness, shortness of breath, wheezing and stridor.   Cardiovascular: Positive for chest pain. Negative for palpitations and leg swelling.  Gastrointestinal: Negative.  Negative for nausea, vomiting, abdominal pain, diarrhea, constipation and blood in stool.  Endocrine: Negative.   Genitourinary: Positive for dysuria and  frequency. Negative for urgency, hematuria, flank pain, decreased urine volume, discharge, penile swelling, scrotal swelling, enuresis, difficulty urinating, penile pain and testicular pain.  Musculoskeletal: Negative.  Negative for myalgias, back pain, arthralgias and neck pain.  Skin: Negative.  Negative for pallor and rash.  Allergic/Immunologic: Negative.   Neurological: Negative.  Negative for dizziness, tremors, syncope, weakness, light-headedness and numbness.  Hematological: Negative.  Negative for adenopathy. Does not bruise/bleed easily.  Psychiatric/Behavioral: Negative.     Objective:  BP 138/88 mmHg  Pulse 72  Temp(Src) 98 F (36.7 C) (Oral)  Resp 20  Ht 5\' 10"  (1.778 m)  Wt 212 lb 6 oz (96.333 kg)  BMI 30.47 kg/m2  SpO2 98%  BP Readings from Last 3 Encounters:  09/10/15 138/88  07/19/15 118/82  07/04/15 120/76    Wt Readings from Last 3 Encounters:  09/10/15 212 lb 6 oz (96.333 kg)  07/19/15 215 lb (97.523 kg)  07/04/15 220 lb (99.791 kg)    Physical Exam  Constitutional: He is oriented to person, place, and time. He appears well-developed and well-nourished. No distress.  HENT:  Head: Normocephalic and atraumatic.  Mouth/Throat: Oropharynx is clear and moist. No oropharyngeal exudate.  Eyes: Conjunctivae are normal. Right eye exhibits no discharge. Left eye exhibits no discharge. No scleral icterus.  Neck: Normal range of motion. Neck supple. No JVD present. No tracheal deviation present. No thyromegaly present.  Cardiovascular: Normal rate, regular rhythm, normal heart sounds and intact distal pulses.  Exam reveals no gallop and no friction rub.   No murmur heard. EKG -  NSR, no LVH, no ST/T wave changes  Pulmonary/Chest: Effort normal and breath sounds normal. No stridor. No respiratory distress. He has no wheezes. He has no rales. He exhibits no tenderness.  Abdominal: Soft. Bowel sounds are normal. He exhibits no distension and no mass. There is no  tenderness. There is no rebound and no guarding. Hernia confirmed negative in the right inguinal area and confirmed negative in the left inguinal area.  Genitourinary: Rectum normal, testes normal and penis normal. Rectal exam shows no external hemorrhoid, no internal hemorrhoid, no fissure, no mass, no tenderness and anal tone normal. Guaiac negative stool. Prostate is enlarged (1+ enlargement and boggy). Right testis shows no mass, no swelling and no tenderness. Right testis is descended. Left testis shows no mass, no swelling and no tenderness. Left testis is descended. Circumcised. No penile erythema or penile tenderness. No discharge found.  Musculoskeletal: Normal range of motion. He exhibits no edema or tenderness.  Lymphadenopathy:    He has no cervical adenopathy.       Right: No inguinal adenopathy present.       Left: No inguinal adenopathy present.  Neurological: He is oriented to person, place, and time.  Skin: Skin is warm and dry. No rash noted. He is not diaphoretic. No erythema. No pallor.  Psychiatric: He has a normal mood and affect. His behavior is normal. Judgment and thought content normal.  Vitals reviewed.   Lab Results  Component Value Date   WBC 8.7 07/04/2015   HGB 14.7 07/04/2015   HCT 44.8 07/04/2015   PLT 315.0 07/04/2015   GLUCOSE 122* 05/10/2015   CHOL 197 09/20/2014   TRIG 170.0* 09/20/2014   HDL 30.40* 09/20/2014   LDLDIRECT 136.1 10/13/2013   LDLCALC 133* 09/20/2014   ALT 37 05/10/2015   AST 22 05/10/2015   NA 139 05/10/2015   K 4.0 05/10/2015   CL 103 05/10/2015   CREATININE 0.91 05/10/2015   BUN 12 05/10/2015   CO2 28 05/10/2015   TSH 1.22 09/20/2014   PSA 0.91 10/13/2013   HGBA1C 6.8* 05/10/2015   MICROALBUR 0.1 09/20/2014    Dg Chest 2 View  07/19/2015  CLINICAL DATA:  Fever. EXAM: CHEST  2 VIEW COMPARISON:  06/20/2014. FINDINGS: Mediastinum and hilar structures normal. Lungs are clear. Heart size normal. No pleural effusion or  pneumothorax. Degenerative changes thoracic spine. IMPRESSION: No acute cardiopulmonary disease.  Chest stable from 06/20/2014 . Electronically Signed   By: Marcello Moores  Register   On: 07/19/2015 12:01    Assessment & Plan:   Spenser was seen today for chest pain and dysuria.  Diagnoses and all orders for this visit:  Diabetes mellitus type 2 in nonobese [facility])- he is due for an A1c, if it is above 7.5% then will address his diabetic regimen. Is also due for his annual renal function test including microalbuminemia screening. -     Basic metabolic panel; Future -     Lipid panel; Future -     Microalbumin / creatinine urine ratio; Future -     Urinalysis, Routine w reflex microscopic (not at [facility]); Future -     Hemoglobin A1c; Future  Hyperlipidemia with target LDL less than 130- he is due for fasting lipid panel, at this time he does not want to start a statin, will advise further after I see today's result -     Lipid panel; Future -     TSH; Future  Precordial pain- his chest pain is very atypical and  sounds like it might be musculoskeletal. I will get a chest x-ray to see if there is a structural lesion such as a rib lesion or an effusion or pneumothorax. Will also check his cardiac enzymes and a troponin to see if there is concern for ischemia. Will check a d-dimer to see if there is concern for pulmonary embolus. I will check a BMP to see if he is expressing any symptoms of heart failure. EKG is normal -     Cardiac panel; Future -     Troponin I; Future -     D-dimer, quantitative (not at [facility]); Future -     Brain natriuretic peptide; Future -     EKG 12-Lead -     DG Chest 2 View; Future  Dysuria- his signs and symptoms are consistent with acute prostatitis. Will treat with Doxy to cover atypical organisms such as Chlamydia and Ureaplasma. Will check his urine to screen for blood and white blood cells. -     doxycycline (DORYX) 100 MG EC tablet; Take 1 tablet (100 mg total) by mouth 2  (two) times daily.  Need for prophylactic vaccination and inoculation against influenza -     Flu Vaccine QUAD 36+ mos PF IM (Fluarix & Fluzone Quad PF)  Need for prophylactic vaccination with combined diphtheria-tetanus-pertussis (DTP) vaccine -     Tdap vaccine greater than or equal to 7yo IM  Prostatitis, acute- will treat the infection with doxycycline -     doxycycline (DORYX) 100 MG EC tablet; Take 1 tablet (100 mg total) by mouth 2 (two) times daily.  I have discontinued Mr. Ashworth's levofloxacin and HYDROcodone-homatropine. I am also having him start on doxycycline. Additionally, I am having him maintain his tadalafil, ranitidine, testosterone cypionate, and SitaGLIPtin-MetFORMIN HCl.  Meds ordered this encounter  Medications  . doxycycline (DORYX) 100 MG EC tablet    Sig: Take 1 tablet (100 mg total) by mouth 2 (two) times daily.    Dispense:  60 tablet    Refill:  0     Follow-up: Return in about 3 weeks (around 10/01/2015).  Scarlette Calico, MD

## 2015-09-10 NOTE — Progress Notes (Signed)
Pre visit review using our clinic review tool, if applicable. No additional management support is needed unless otherwise documented below in the visit note. 

## 2015-09-11 ENCOUNTER — Encounter: Payer: Self-pay | Admitting: Internal Medicine

## 2015-09-11 ENCOUNTER — Telehealth: Payer: Self-pay

## 2015-09-11 LAB — D-DIMER, QUANTITATIVE (NOT AT ARMC): D DIMER QUANT: 0.38 ug{FEU}/mL (ref 0.00–0.48)

## 2015-09-11 LAB — CK TOTAL AND CKMB (NOT AT ARMC): Total CK: 766 U/L (ref 24–204)

## 2015-09-11 NOTE — Telephone Encounter (Signed)
Alert Lab: Ck total [phone]

## 2015-09-13 ENCOUNTER — Telehealth: Payer: Self-pay | Admitting: Internal Medicine

## 2015-09-13 DIAGNOSIS — R300 Dysuria: Secondary | ICD-10-CM

## 2015-09-13 DIAGNOSIS — N410 Acute prostatitis: Secondary | ICD-10-CM

## 2015-09-13 NOTE — Telephone Encounter (Signed)
Patient states he was prescribed a medication for a prostate infection (does not know the name of medication) and that it needed approval.  He is requesting approval.  Patient uses [facility] at [facility].

## 2015-09-20 ENCOUNTER — Telehealth: Payer: Self-pay

## 2015-09-20 MED ORDER — DOXYCYCLINE MONOHYDRATE 100 MG PO CAPS: 100.0000 mg | ORAL_CAPSULE | Freq: Two times a day (BID) | ORAL | Status: DC

## 2015-09-20 NOTE — Telephone Encounter (Signed)
Pt call in again about this, do you happen to know the status?

## 2015-09-20 NOTE — Telephone Encounter (Signed)
Ins will not cover med sent on 12/5. Please advise for aLternative

## 2015-09-20 NOTE — Telephone Encounter (Signed)
A user error has taken place.

## 2015-09-20 NOTE — Telephone Encounter (Signed)
changed

## 2015-11-20 ENCOUNTER — Telehealth: Payer: Self-pay

## 2015-11-20 DIAGNOSIS — E119 Type 2 diabetes mellitus without complications: Secondary | ICD-10-CM

## 2015-11-20 NOTE — Telephone Encounter (Signed)
PA papewwork completed and placed on MD's desk for signature

## 2015-11-20 NOTE — Telephone Encounter (Signed)
Paperwork signed and faxed.

## 2015-11-21 MED ORDER — SAXAGLIPTIN-METFORMIN ER 5-1000 MG PO TB24: 1.0000 | ORAL_TABLET | Freq: Every day | ORAL | Status: DC

## 2015-11-21 NOTE — Telephone Encounter (Signed)
changed

## 2015-11-21 NOTE — Telephone Encounter (Signed)
PA denied - pt is now required to try and fail Kombiglyze only. Please advise on alternative, thanks

## 2015-11-21 NOTE — Telephone Encounter (Signed)
Pt advised via personal VM 

## 2015-11-27 ENCOUNTER — Telehealth: Payer: Self-pay | Admitting: *Deleted

## 2015-11-27 MED ORDER — TESTOSTERONE CYPIONATE 200 MG/ML IM SOLN: INTRAMUSCULAR | Status: DC

## 2015-11-27 NOTE — Telephone Encounter (Signed)
Called pt no answer LMOm rx [facility].../lmb 

## 2015-11-27 NOTE — Telephone Encounter (Signed)
done

## 2015-11-27 NOTE — Telephone Encounter (Signed)
Left msg on triage needing refill on his testosterone...Marcus Schultz

## 2015-11-28 NOTE — Telephone Encounter (Signed)
Pt advised via personal VM, card in cabinet for pt pick up

## 2015-11-28 NOTE — Telephone Encounter (Signed)
Pt called back and the Kombiglyze cost $65 and he is wondering if there is a coupon for him. Can you please give him a call at [phone]

## 2015-11-28 NOTE — Telephone Encounter (Signed)
I do have a co-pay card for him

## 2015-12-07 ENCOUNTER — Other Ambulatory Visit: Payer: Self-pay | Admitting: Internal Medicine

## 2016-01-01 ENCOUNTER — Ambulatory Visit (INDEPENDENT_AMBULATORY_CARE_PROVIDER_SITE_OTHER)
Admission: RE | Admit: 2016-01-01 | Discharge: 2016-01-01 | Disposition: A | Discharge status: Home / Self Care | Payer: BLUE CROSS/BLUE SHIELD | Source: Ambulatory Visit | Attending: Internal Medicine | Admitting: Internal Medicine

## 2016-01-01 ENCOUNTER — Telehealth: Payer: Self-pay

## 2016-01-01 ENCOUNTER — Ambulatory Visit (INDEPENDENT_AMBULATORY_CARE_PROVIDER_SITE_OTHER): Payer: BLUE CROSS/BLUE SHIELD | Admitting: Internal Medicine

## 2016-01-01 ENCOUNTER — Encounter: Payer: Self-pay | Admitting: Internal Medicine

## 2016-01-01 VITALS — BP 110/80 | HR 70 | Temp 98.4°F | Resp 16 | Ht 70.0 in | Wt 213.0 lb

## 2016-01-01 DIAGNOSIS — R05 Cough: Secondary | ICD-10-CM

## 2016-01-01 DIAGNOSIS — R059 Cough, unspecified: Secondary | ICD-10-CM

## 2016-01-01 DIAGNOSIS — J22 Unspecified acute lower respiratory infection: Secondary | ICD-10-CM

## 2016-01-01 DIAGNOSIS — J988 Other specified respiratory disorders: Secondary | ICD-10-CM

## 2016-01-01 MED ORDER — AZITHROMYCIN 500 MG PO TABS: 500.0000 mg | ORAL_TABLET | Freq: Every day | ORAL | Status: DC

## 2016-01-01 MED ORDER — CEFDINIR 300 MG PO CAPS: 300.0000 mg | ORAL_CAPSULE | Freq: Two times a day (BID) | ORAL | Status: DC

## 2016-01-01 MED ORDER — HYDROCODONE-HOMATROPINE 5-1.5 MG/5ML PO SYRP: 5.0000 mL | ORAL_SOLUTION | Freq: Three times a day (TID) | ORAL | Status: DC | PRN

## 2016-01-01 NOTE — Patient Instructions (Signed)

## 2016-01-01 NOTE — Progress Notes (Signed)
Pre visit review using our clinic review tool, if applicable. No additional management support is needed unless otherwise documented below in the visit note. 

## 2016-01-01 NOTE — Progress Notes (Signed)
Subjective:  Patient ID: Marcus Schultz, male    DOB: September 10, 1976  Age: 40 y.o. MRN: [id number]  CC: Cough   HPI Marcus Schultz presents for a 3 day hx of cough that is productive of thick, yellow phlegm. He has had a mild headache, sore throat, chills, shortness of breath but no fever, diaphoresis or night sweats.  Outpatient Prescriptions Prior to Visit  Medication Sig Dispense Refill  . ranitidine (ZANTAC) 150 MG tablet Take 1 tablet (150 mg total) by mouth 2 (two) times daily. 60 tablet 0  . Saxagliptin-Metformin (KOMBIGLYZE XR) 02-999 MG TB24 Take 1 tablet by mouth daily. 90 tablet 1  . tadalafil (CIALIS) 5 MG tablet Take 1 tablet (5 mg total) by mouth daily as needed for erectile dysfunction. 10 tablet 11  . testosterone cypionate (DEPOTESTOSTERONE CYPIONATE) 200 MG/ML injection INJECT 1 ML IN THE MUSCLE EVERY 14 DAYS 10 mL 0  . doxycycline (MONODOX) 100 MG capsule TAKE 1 CAPSULE(100 MG) BY MOUTH TWICE DAILY 60 capsule 2   No facility-administered medications prior to visit.    ROS Review of Systems  Constitutional: Positive for chills. Negative for fever, diaphoresis, activity change, appetite change, fatigue and unexpected weight change.  HENT: Positive for sore throat. Negative for congestion, facial swelling, sinus pressure, trouble swallowing and voice change.   Eyes: Negative.   Respiratory: Positive for cough. Negative for choking, chest tightness, shortness of breath, wheezing and stridor.   Cardiovascular: Negative.  Negative for chest pain, palpitations and leg swelling.  Gastrointestinal: Negative.  Negative for nausea, vomiting, abdominal pain, diarrhea, constipation and blood in stool.  Endocrine: Negative.   Genitourinary: Negative.   Musculoskeletal: Negative.  Negative for myalgias, back pain and joint swelling.  Skin: Negative.  Negative for color change and rash.  Allergic/Immunologic: Negative.  Negative for immunocompromised state.  Neurological: Negative.     Hematological: Negative.  Negative for adenopathy. Does not bruise/bleed easily.  Psychiatric/Behavioral: Negative.     Objective:  BP 110/80 mmHg  Pulse 70  Temp(Src) 98.4 F (36.9 C) (Oral)  Resp 16  Ht 5\' 10"  (1.778 m)  Wt 213 lb (96.616 kg)  BMI 30.56 kg/m2  SpO2 96%  BP Readings from Last 3 Encounters:  01/01/16 110/80  09/10/15 138/88  07/19/15 118/82    Wt Readings from Last 3 Encounters:  01/01/16 213 lb (96.616 kg)  09/10/15 212 lb 6 oz (96.333 kg)  07/19/15 215 lb (97.523 kg)    Physical Exam  Constitutional: He is oriented to person, place, and time.  Non-toxic appearance. He does not have a sickly appearance. He does not appear ill. No distress.  HENT:  Mouth/Throat: Oropharynx is clear and moist. No oral lesions. No trismus in the jaw. No uvula swelling. No oropharyngeal exudate, posterior oropharyngeal edema, posterior oropharyngeal erythema or tonsillar abscesses.  Eyes: Conjunctivae are normal. Right eye exhibits no discharge. Left eye exhibits no discharge. No scleral icterus.  Neck: Normal range of motion. Neck supple. No JVD present. No tracheal deviation present. No thyromegaly present.  Cardiovascular: Normal rate, regular rhythm, normal heart sounds and intact distal pulses.  Exam reveals no gallop and no friction rub.   No murmur heard. Pulmonary/Chest: Effort normal and breath sounds normal. No respiratory distress. He has no wheezes. He has no rales. He exhibits no tenderness.  Abdominal: Soft. Bowel sounds are normal. He exhibits no distension and no mass. There is no tenderness. There is no rebound and no guarding.  Musculoskeletal: Normal range of motion. He  exhibits no edema or tenderness.  Lymphadenopathy:    He has no cervical adenopathy.  Neurological: He is oriented to person, place, and time.  Skin: Skin is warm and dry. No rash noted. He is not diaphoretic. No erythema. No pallor.  Vitals reviewed.   Lab Results  Component Value Date    WBC 8.7 07/04/2015   HGB 14.7 07/04/2015   HCT 44.8 07/04/2015   PLT 315.0 07/04/2015   GLUCOSE 124* 09/10/2015   CHOL 171 09/10/2015   TRIG 157.0* 09/10/2015   HDL 28.90* 09/10/2015   LDLDIRECT 136.1 10/13/2013   LDLCALC 111* 09/10/2015   ALT 37 05/10/2015   AST 22 05/10/2015   NA 140 09/10/2015   K 4.6 09/10/2015   CL 104 09/10/2015   CREATININE 0.85 09/10/2015   BUN 12 09/10/2015   CO2 27 09/10/2015   TSH 1.50 09/10/2015   PSA 0.91 10/13/2013   HGBA1C 6.5 09/10/2015   MICROALBUR <0.7 09/10/2015    Dg Chest 2 View  09/10/2015  CLINICAL DATA:  Left-sided chest pain. EXAM: CHEST  2 VIEW COMPARISON:  07/19/2015. FINDINGS: Mediastinum and hilar structures normal. Borderline cardiomegaly with normal pulmonary vascularity. Mild left base subsegmental atelectasis . Degenerative changes thoracic spine. IMPRESSION: 1. Borderline cardiomegaly. Mild left base subsegmental atelectasis. 2.  Degenerative changes thoracic spine. Electronically Signed   By: Marcello Moores  Register   On: 09/10/2015 11:59    Assessment & Plan:   Niguel was seen today for cough.  Diagnoses and all orders for this visit:  Cough- his chest x-rays normal -     HYDROcodone-homatropine (HYCODAN) 5-1.5 MG/5ML syrup; Take 5 mLs by mouth every 8 (eight) hours as needed for cough. -     DG Chest 2 View; Future  Lower resp. tract infection- I originally wrote a prescription for Cefdinir to treat his lung infection but he requested that it be changed to a tablet rather than a capsule so I then prescribed Zithromax. Will control the cough with Hycodan syrup as needed. -     Discontinue: cefdinir (OMNICEF) 300 MG capsule; Take 1 capsule (300 mg total) by mouth 2 (two) times daily. -     HYDROcodone-homatropine (HYCODAN) 5-1.5 MG/5ML syrup; Take 5 mLs by mouth every 8 (eight) hours as needed for cough. -     azithromycin (ZITHROMAX) 500 MG tablet; Take 1 tablet (500 mg total) by mouth daily.   I have discontinued Mr.  Osso's doxycycline and cefdinir. I am also having him start on HYDROcodone-homatropine and azithromycin. Additionally, I am having him maintain his tadalafil, ranitidine, Saxagliptin-Metformin, and testosterone cypionate.  Meds ordered this encounter  Medications  . DISCONTD: cefdinir (OMNICEF) 300 MG capsule    Sig: Take 1 capsule (300 mg total) by mouth 2 (two) times daily.    Dispense:  20 capsule    Refill:  1  . HYDROcodone-homatropine (HYCODAN) 5-1.5 MG/5ML syrup    Sig: Take 5 mLs by mouth every 8 (eight) hours as needed for cough.    Dispense:  120 mL    Refill:  0  . azithromycin (ZITHROMAX) 500 MG tablet    Sig: Take 1 tablet (500 mg total) by mouth daily.    Dispense:  3 tablet    Refill:  0     Follow-up: Return in about 3 weeks (around 01/22/2016).  Scarlette Calico, MD

## 2016-01-01 NOTE — Telephone Encounter (Signed)
Pt states PA is needed for testosterone can you initate and update pt on status. Thank you

## 2016-01-02 ENCOUNTER — Encounter: Payer: Self-pay | Admitting: Internal Medicine

## 2016-01-02 NOTE — Telephone Encounter (Signed)
PA pending ROV to follow up on Testosterone labs, pt advised of same

## 2016-01-08 ENCOUNTER — Telehealth: Payer: Self-pay | Admitting: *Deleted

## 2016-01-08 MED ORDER — TESTOSTERONE CYPIONATE 200 MG/ML IM SOLN: INTRAMUSCULAR | Status: DC

## 2016-01-08 NOTE — Telephone Encounter (Signed)
Left msg on triage requesting refill on his Testosterone...Marcus Schultz

## 2016-01-08 NOTE — Telephone Encounter (Signed)
rx written

## 2016-01-08 NOTE — Telephone Encounter (Signed)
Called pt no answer LMOM [facility].../lmb 

## 2016-01-10 ENCOUNTER — Encounter: Payer: Self-pay | Admitting: Internal Medicine

## 2016-01-10 ENCOUNTER — Ambulatory Visit (INDEPENDENT_AMBULATORY_CARE_PROVIDER_SITE_OTHER): Payer: BLUE CROSS/BLUE SHIELD | Admitting: Internal Medicine

## 2016-01-10 ENCOUNTER — Other Ambulatory Visit (INDEPENDENT_AMBULATORY_CARE_PROVIDER_SITE_OTHER): Payer: BLUE CROSS/BLUE SHIELD

## 2016-01-10 VITALS — BP 100/78 | HR 82 | Temp 98.1°F | Resp 16 | Ht 70.0 in | Wt 216.0 lb

## 2016-01-10 DIAGNOSIS — E119 Type 2 diabetes mellitus without complications: Secondary | ICD-10-CM

## 2016-01-10 DIAGNOSIS — J988 Other specified respiratory disorders: Secondary | ICD-10-CM | POA: Diagnosis not present

## 2016-01-10 DIAGNOSIS — E291 Testicular hypofunction: Secondary | ICD-10-CM | POA: Diagnosis not present

## 2016-01-10 DIAGNOSIS — R7989 Other specified abnormal findings of blood chemistry: Secondary | ICD-10-CM

## 2016-01-10 DIAGNOSIS — J22 Unspecified acute lower respiratory infection: Secondary | ICD-10-CM

## 2016-01-10 LAB — BASIC METABOLIC PANEL
BUN: 13 mg/dL (ref 6–23)
CALCIUM: 9.5 mg/dL (ref 8.4–10.5)
CHLORIDE: 102 meq/L (ref 96–112)
CO2: 31 meq/L (ref 19–32)
Creatinine, Ser: 0.89 mg/dL (ref 0.40–1.50)
GFR: 101 mL/min (ref >60.00)
Glucose, Bld: 155 mg/dL — ABNORMAL HIGH (ref 70–99)
Potassium: 4.2 mEq/L (ref 3.5–5.1)
SODIUM: 137 meq/L (ref 135–145)

## 2016-01-10 LAB — HEMOGLOBIN A1C: HEMOGLOBIN A1C: 7.1 % — AB (ref 4.6–6.5)

## 2016-01-10 MED ORDER — GLUCOSE BLOOD VI STRP: ORAL_STRIP | Status: DC

## 2016-01-10 MED ORDER — ONETOUCH VERIO FLEX SYSTEM W/DEVICE KIT: 1.0000 | PACK | Freq: Three times a day (TID) | Status: AC

## 2016-01-10 NOTE — Patient Instructions (Signed)

## 2016-01-10 NOTE — Progress Notes (Signed)
Pre visit review using our clinic review tool, if applicable. No additional management support is needed unless otherwise documented below in the visit note. 

## 2016-01-11 ENCOUNTER — Telehealth: Payer: Self-pay

## 2016-01-11 DIAGNOSIS — R7989 Other specified abnormal findings of blood chemistry: Secondary | ICD-10-CM

## 2016-01-11 NOTE — Progress Notes (Signed)
Subjective:  Patient ID: Marcus Schultz, male    DOB: March 24, 1976  Age: 40 y.o. MRN: [id number]  CC: Diabetes and URI   HPI Marcus Schultz presents for f/up on his recent upper respiratory infection. He tells me that his cough and other symptoms are improving. He has had no recent fever, chills, shortness of breath, or night sweats. He completed the antibiotics with no complications or side effects.  He is also due for follow-up on diabetes and hypogonadism. He tells me that his blood sugars been well controlled and he denies polyuria, polydipsia, polyphagia.  Outpatient Prescriptions Prior to Visit  Medication Sig Dispense Refill  . ranitidine (ZANTAC) 150 MG tablet Take 1 tablet (150 mg total) by mouth 2 (two) times daily. 60 tablet 0  . Saxagliptin-Metformin (KOMBIGLYZE XR) 02-999 MG TB24 Take 1 tablet by mouth daily. 90 tablet 1  . tadalafil (CIALIS) 5 MG tablet Take 1 tablet (5 mg total) by mouth daily as needed for erectile dysfunction. 10 tablet 11  . testosterone cypionate (DEPOTESTOSTERONE CYPIONATE) 200 MG/ML injection INJECT 1 ML IN THE MUSCLE EVERY 14 DAYS 10 mL 0  . azithromycin (ZITHROMAX) 500 MG tablet Take 1 tablet (500 mg total) by mouth daily. 3 tablet 0  . HYDROcodone-homatropine (HYCODAN) 5-1.5 MG/5ML syrup Take 5 mLs by mouth every 8 (eight) hours as needed for cough. 120 mL 0   No facility-administered medications prior to visit.    ROS Review of Systems  Constitutional: Negative.  Negative for fever, chills, diaphoresis, appetite change and fatigue.  HENT: Negative.   Eyes: Negative.   Respiratory: Positive for cough. Negative for choking, chest tightness, shortness of breath and stridor.   Cardiovascular: Negative.  Negative for chest pain, palpitations and leg swelling.  Gastrointestinal: Negative.  Negative for nausea, vomiting, abdominal pain, diarrhea and constipation.  Endocrine: Negative.   Genitourinary: Negative.   Musculoskeletal: Negative.  Negative  for myalgias, back pain, joint swelling and arthralgias.  Skin: Negative.  Negative for color change and rash.  Allergic/Immunologic: Negative.   Neurological: Negative.  Negative for dizziness, weakness, light-headedness and numbness.  Hematological: Negative.  Negative for adenopathy. Does not bruise/bleed easily.  Psychiatric/Behavioral: Negative.     Objective:  BP 100/78 mmHg  Pulse 82  Temp(Src) 98.1 F (36.7 C) (Oral)  Resp 16  Ht '5\' 10"'$  (1.778 m)  Wt 216 lb (97.977 kg)  BMI 30.99 kg/m2  SpO2 96%  BP Readings from Last 3 Encounters:  01/10/16 100/78  01/01/16 110/80  09/10/15 138/88    Wt Readings from Last 3 Encounters:  01/10/16 216 lb (97.977 kg)  01/01/16 213 lb (96.616 kg)  09/10/15 212 lb 6 oz (96.333 kg)    Physical Exam  Constitutional: He is oriented to person, place, and time.  Non-toxic appearance. He does not have a sickly appearance. He does not appear ill. No distress.  HENT:  Mouth/Throat: Oropharynx is clear and moist. No oropharyngeal exudate.  Eyes: Conjunctivae are normal. Right eye exhibits no discharge. Left eye exhibits no discharge. No scleral icterus.  Neck: Normal range of motion. Neck supple. No JVD present. No tracheal deviation present. No thyromegaly present.  Cardiovascular: Normal rate, regular rhythm, normal heart sounds and intact distal pulses.  Exam reveals no gallop and no friction rub.   No murmur heard. Pulmonary/Chest: Effort normal and breath sounds normal. No stridor. No respiratory distress. He has no wheezes. He has no rales. He exhibits no tenderness.  Abdominal: Soft. Bowel sounds are normal. He exhibits  no distension and no mass. There is no tenderness. There is no rebound and no guarding.  Musculoskeletal: Normal range of motion. He exhibits no edema or tenderness.  Lymphadenopathy:    He has no cervical adenopathy.  Neurological: He is oriented to person, place, and time.  Skin: Skin is warm and dry. No rash noted. He  is not diaphoretic. No erythema. No pallor.  Psychiatric: He has a normal mood and affect. His behavior is normal. Judgment and thought content normal.  Vitals reviewed.   Lab Results  Component Value Date   WBC 8.7 07/04/2015   HGB 14.7 07/04/2015   HCT 44.8 07/04/2015   PLT 315.0 07/04/2015   GLUCOSE 155* 01/10/2016   CHOL 171 09/10/2015   TRIG 157.0* 09/10/2015   HDL 28.90* 09/10/2015   LDLDIRECT 136.1 10/13/2013   LDLCALC 111* 09/10/2015   ALT 37 05/10/2015   AST 22 05/10/2015   NA 137 01/10/2016   K 4.2 01/10/2016   CL 102 01/10/2016   CREATININE 0.89 01/10/2016   BUN 13 01/10/2016   CO2 31 01/10/2016   TSH 1.50 09/10/2015   PSA 0.91 10/13/2013   HGBA1C 7.1* 01/10/2016   MICROALBUR <0.7 09/10/2015    Dg Chest 2 View  01/02/2016  CLINICAL DATA:  Cough. EXAM: CHEST  2 VIEW COMPARISON:  09/10/2015 FINDINGS: Normal heart size and mediastinal contours. No acute infiltrate or edema. No effusion or pneumothorax. No acute osseous findings. IMPRESSION: Negative chest. Electronically Signed   By: Monte Fantasia M.D.   On: 01/02/2016 08:08    Assessment & Plan:   Rani was seen today for diabetes and uri.  Diagnoses and all orders for this visit:  Diabetes mellitus type 2 in nonobese [facility])- his A1c is 7.1%, blood sugars are adequately well controlled on his current regimen so will continue. He is due for his annual eye exams are made that referral. -     Ambulatory referral to Ophthalmology -     Hemoglobin A1c; Future -     Basic metabolic panel; Future -     Blood Glucose Monitoring Suppl ([facility]) w/Device KIT; 1 Act by Does not apply route 3 (three) times daily with meals. -     glucose blood test strip; Use as instructed  Hypogonadism male- he is doing well on the current testosterone replacement therapy.  Lower resp. tract infection- this has resolved.  Low testosterone   I have discontinued Marcus Schultz's HYDROcodone-homatropine and  azithromycin. I am also having him start on [place] and glucose blood. Additionally, I am having him maintain his tadalafil, ranitidine, Saxagliptin-Metformin, testosterone cypionate, and doxycycline.  Meds ordered this encounter  Medications  . doxycycline (MONODOX) 100 MG capsule    Sig:     Refill:  0  . Blood Glucose Monitoring Suppl (ONETOUCH VERIO FLEX SYSTEM) w/Device KIT    Sig: 1 Act by Does not apply route 3 (three) times daily with meals.    Dispense:  2 kit    Refill:  0  . glucose blood test strip    Sig: Use as instructed    Dispense:  100 each    Refill:  12     Follow-up: Return in about 3 weeks (around 01/31/2016).  Scarlette Calico, MD

## 2016-01-11 NOTE — Telephone Encounter (Signed)
PA initiated via CoverMyMeds key [id number]

## 2016-01-15 ENCOUNTER — Telehealth: Payer: Self-pay | Admitting: *Deleted

## 2016-01-15 DIAGNOSIS — E119 Type 2 diabetes mellitus without complications: Secondary | ICD-10-CM

## 2016-01-15 MED ORDER — GLUCOSE BLOOD VI STRP: 1.0000 | ORAL_STRIP | Freq: Two times a day (BID) | Status: AC

## 2016-01-15 NOTE — Telephone Encounter (Signed)
PA was denied because pt's testosterone levels were not considered low by [employer].  Is it possible to have pt come in for testosterone lab so that I can call for an appeal and advised of his current level while on therapy?

## 2016-01-15 NOTE — Telephone Encounter (Signed)
Received call pt states md sent rx for BS strips but pharmacy is needing frequency. Verified how many time he check BS infor  Pt will send updated script to [facility]...Marcus Schultz

## 2016-01-15 NOTE — Telephone Encounter (Signed)
yes

## 2016-01-16 NOTE — Telephone Encounter (Signed)
Pt advised via personal VM that medication was denied and labs are needed.  Lab order entered

## 2016-01-17 ENCOUNTER — Other Ambulatory Visit (INDEPENDENT_AMBULATORY_CARE_PROVIDER_SITE_OTHER): Payer: BLUE CROSS/BLUE SHIELD

## 2016-01-17 ENCOUNTER — Encounter: Payer: Self-pay | Admitting: Internal Medicine

## 2016-01-17 DIAGNOSIS — R7989 Other specified abnormal findings of blood chemistry: Secondary | ICD-10-CM

## 2016-01-17 DIAGNOSIS — E291 Testicular hypofunction: Secondary | ICD-10-CM | POA: Diagnosis not present

## 2016-01-17 DIAGNOSIS — R072 Precordial pain: Secondary | ICD-10-CM

## 2016-01-17 LAB — CARDIAC PANEL
CK-MB: 7.3 ng/mL — ABNORMAL HIGH (ref 0.3–4.0)
Relative Index: 1.3 calc (ref 0.0–2.5)
Total CK: 541 U/L — ABNORMAL HIGH (ref 7–232)

## 2016-01-17 LAB — TESTOSTERONE: Testosterone: 239.67 ng/dL — ABNORMAL LOW (ref 300.00–890.00)

## 2016-01-29 ENCOUNTER — Encounter: Payer: Self-pay | Admitting: Internal Medicine

## 2016-01-29 NOTE — Telephone Encounter (Signed)
[id number]

## 2016-01-29 NOTE — Telephone Encounter (Signed)
PA resubmitted with new lab values via CoverMyMeds Key

## 2016-02-04 LAB — HM DIABETES EYE EXAM

## 2016-02-04 NOTE — Telephone Encounter (Signed)
PA APPROVED per pharmacy. Pt pick up 01/31/2016

## 2016-02-08 ENCOUNTER — Encounter: Payer: Self-pay | Admitting: Internal Medicine

## 2016-04-08 IMAGING — CT CT ABD-PELV W/ CM
2 of 4 series · 17 of 46 positions shown, 19 images · IV contrast (Omnipaque 300)
Comparison: None.

CLINICAL DATA: Left lower quadrant pain radiating to the left
testicle for 2 weeks

EXAM:
CT ABDOMEN AND PELVIS WITH CONTRAST
TECHNIQUE: Multidetector CT imaging of the abdomen and pelvis was performed
using the standard protocol following bolus administration of
intravenous contrast.
CONTRAST:  100mL OMNIPAQUE IOHEXOL 300 MG/ML  SOLN

[Series 2: abd/ pel 5mm · axial · 0.72mm/px · z∈[-502,-52]mm · 14 of 98 slices shown, 16 images]
[im 4/98  soft-tissue]
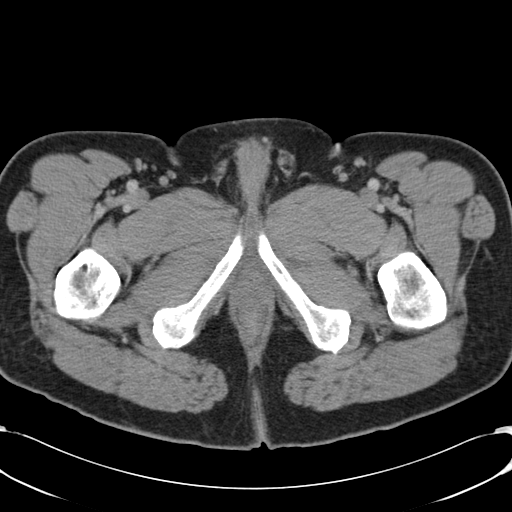
[im 4/98  bone]
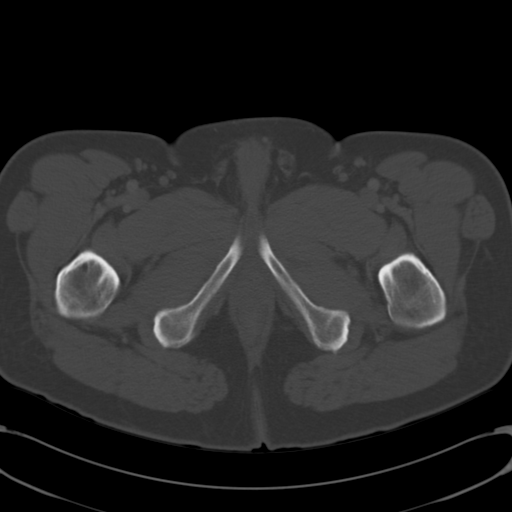
[im 12/98  soft-tissue]
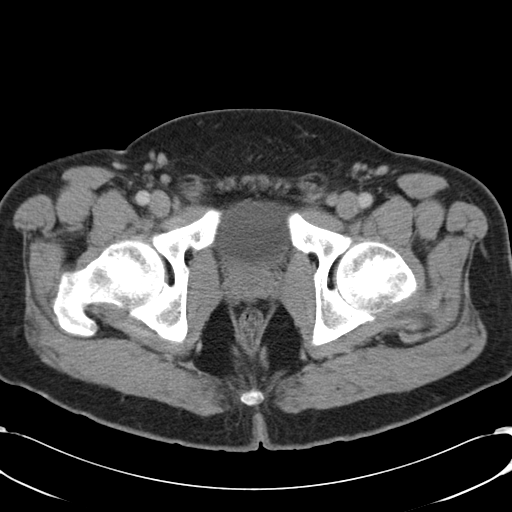
[im 20/98  soft-tissue]
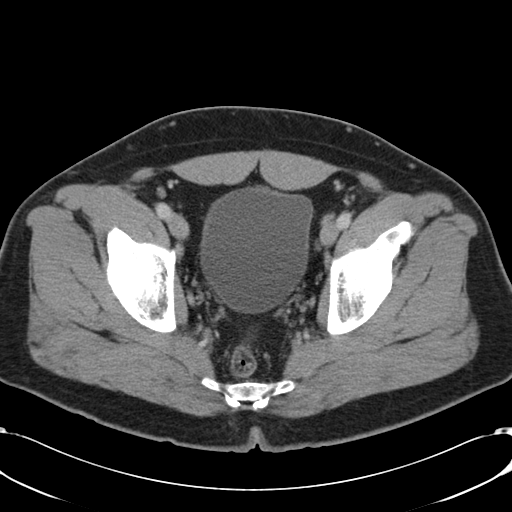
[im 28/98  soft-tissue]
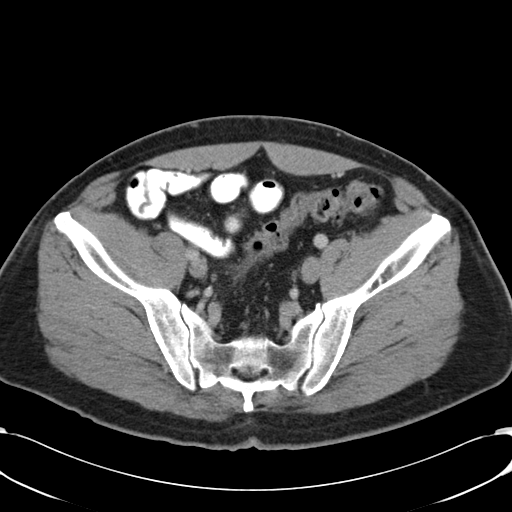
[im 32/98  soft-tissue]
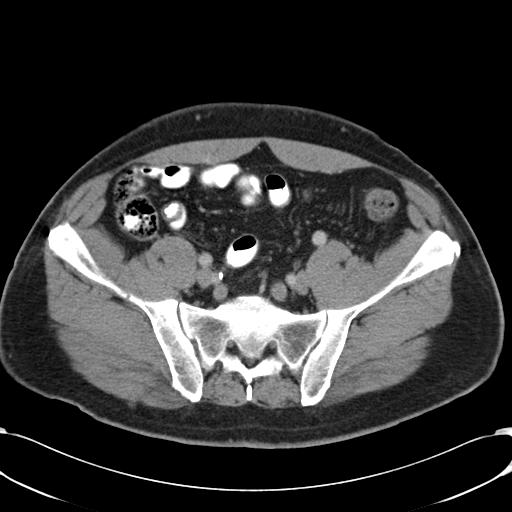
[im 39/98  soft-tissue]
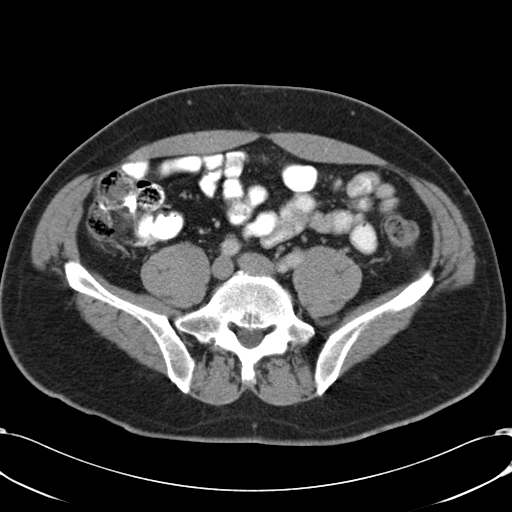
[im 47/98  soft-tissue]
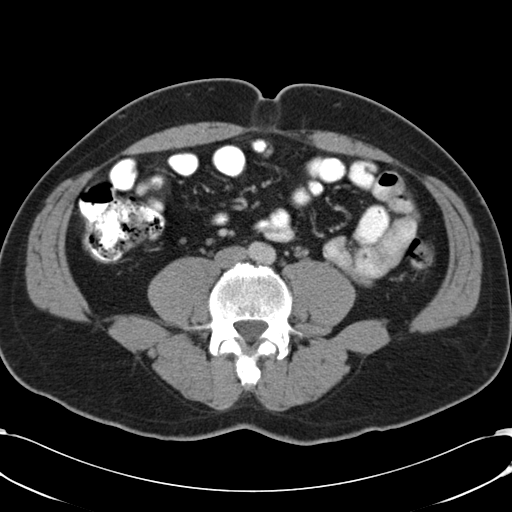
[im 51/98  soft-tissue]
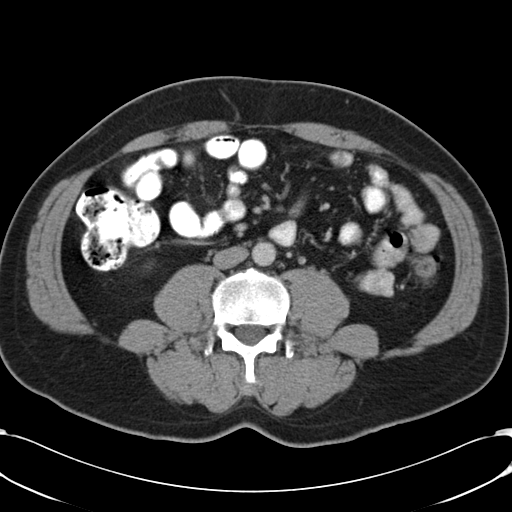
[im 59/98  soft-tissue]
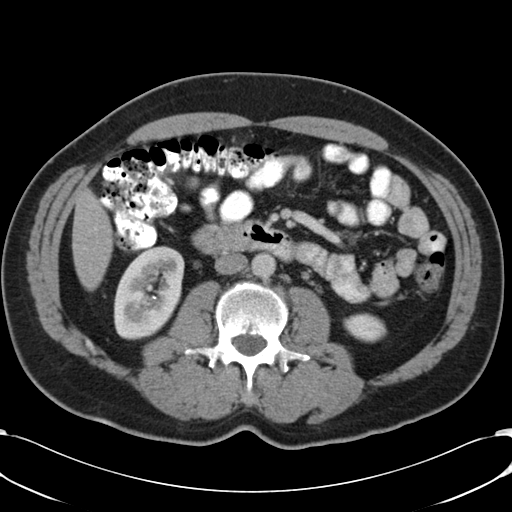
[im 59/98  bone]
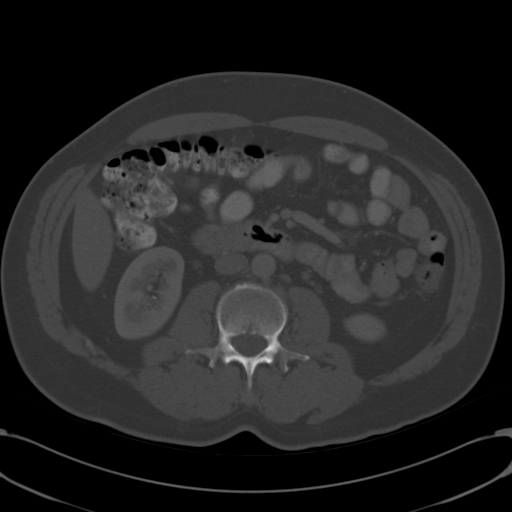
[im 66/98  soft-tissue]
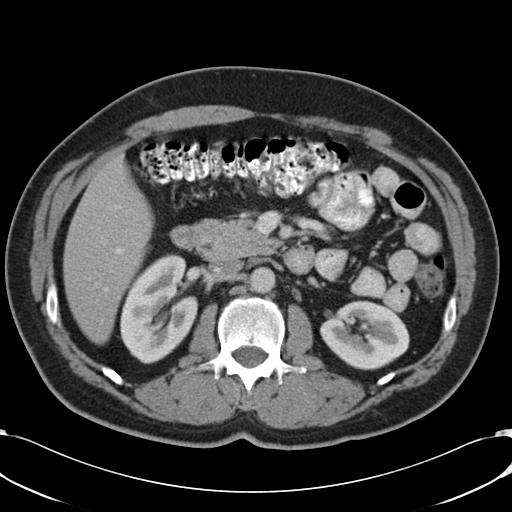
[im 74/98  soft-tissue]
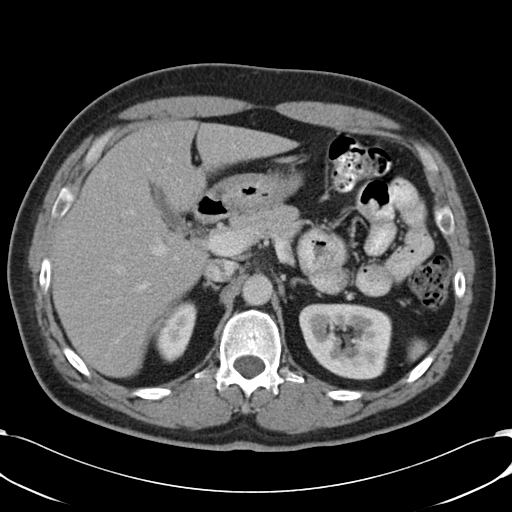
[im 78/98  soft-tissue]
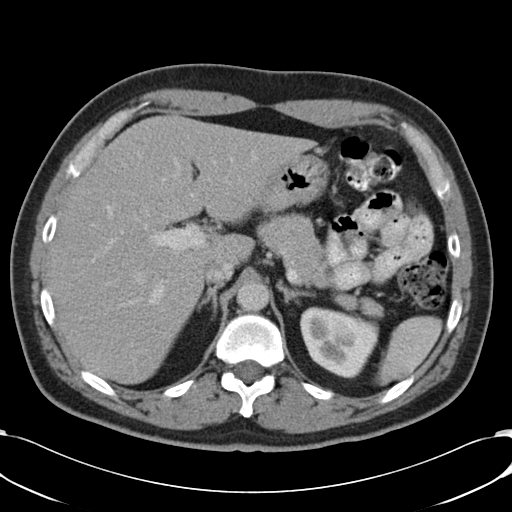
[im 86/98  soft-tissue]
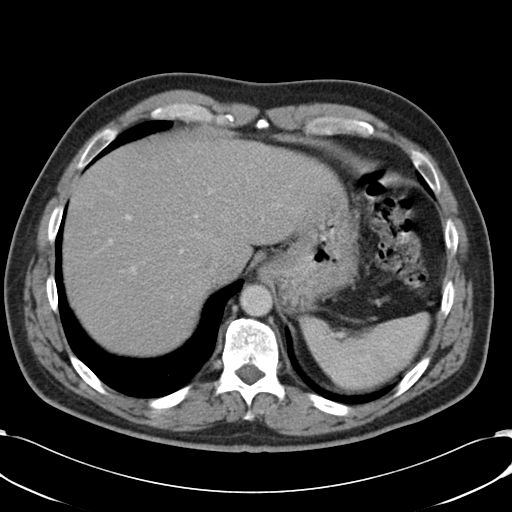
[im 94/98  soft-tissue]
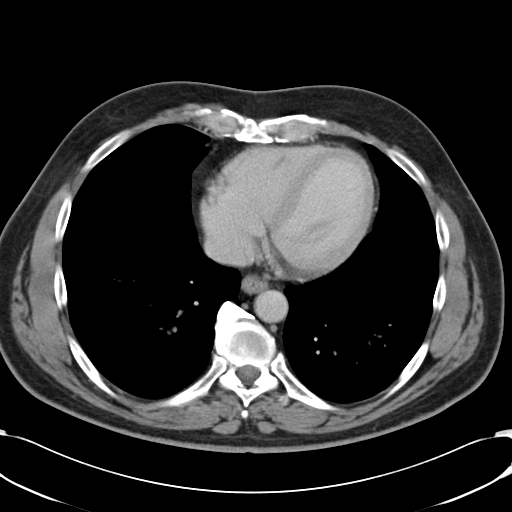

[Series 602: cor · coronal · 0.98mm/px · 3 of 114 slices shown]
[im 38/114  soft-tissue]
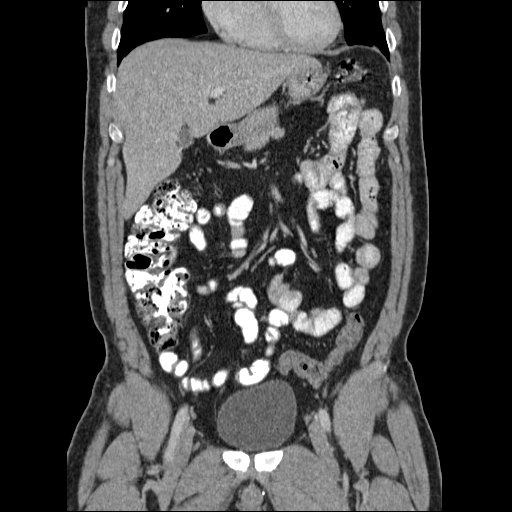
[im 51/114  soft-tissue]
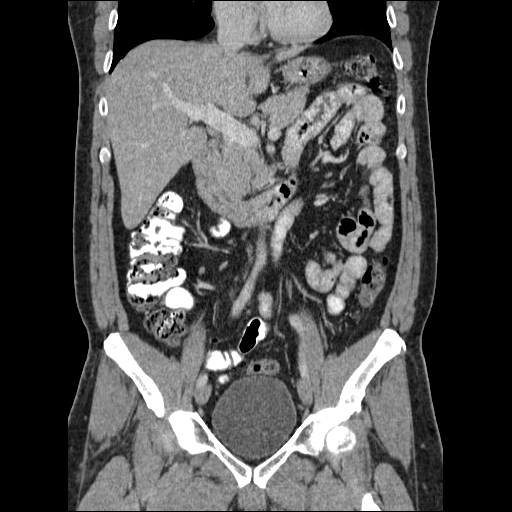
[im 63/114  soft-tissue]
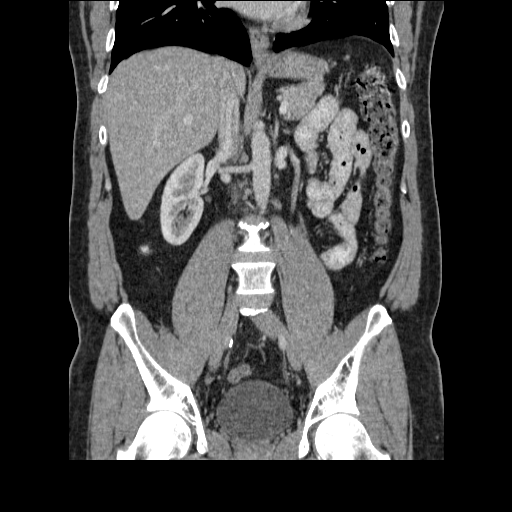

[17 of 46 positions shown; findings below may reference images not displayed]

FINDINGS: The lung bases are clear. The liver enhances with no focal
abnormality and no ductal dilatation is seen. The gallbladder is
somewhat contracted and no calcified gallstones are noted. The
pancreas is normal in size and the pancreatic duct is not dilated.
The adrenal glands and spleen are unremarkable. The stomach is
decompressed. A single 5 mm nonobstructing right lower pole renal
calculus is present. No additional renal calculi are noted. The
proximal ureters are normal in caliber. The abdominal aorta is
normal in caliber.

The distal ureters are normal in caliber and no distal ureteral
calculus is seen. The urinary bladder is moderately urine distended
with no abnormality noted. The prostate is normal in size. The colon
is largely decompressed. The terminal ileum appears normal, and the
appendix is unremarkable. The lumbar vertebrae are in normal
alignment with normal disc spaces. Only the L5-S1 disc space is
slightly narrowed.
IMPRESSION: 1. 5 mm nonobstructing right lower pole renal calculus. No ureteral
calculi are noted. No hydronephrosis is seen.
2. The terminal ileum and the appendix are unremarkable.

## 2016-04-15 ENCOUNTER — Telehealth: Payer: Self-pay | Admitting: Internal Medicine

## 2016-04-15 ENCOUNTER — Encounter: Payer: Self-pay | Admitting: Internal Medicine

## 2016-04-15 NOTE — Telephone Encounter (Signed)
LM ON VMAIL TO CALL  BACK TO SCHEDULE COLONOSCOPY

## 2016-05-30 ENCOUNTER — Encounter: Payer: Self-pay | Admitting: Internal Medicine

## 2016-05-30 ENCOUNTER — Ambulatory Visit (AMBULATORY_SURGERY_CENTER): Payer: Self-pay

## 2016-05-30 VITALS — Ht 70.0 in | Wt 211.0 lb

## 2016-05-30 DIAGNOSIS — Z860100 Personal history of colon polyps, unspecified: Secondary | ICD-10-CM

## 2016-05-30 NOTE — Progress Notes (Signed)
No allergies to eggs or soy No past problems with anesthesia No home oxygen No diet meds  Declined emmi 

## 2016-06-13 ENCOUNTER — Ambulatory Visit (AMBULATORY_SURGERY_CENTER): Payer: BLUE CROSS/BLUE SHIELD | Admitting: Internal Medicine

## 2016-06-13 ENCOUNTER — Encounter: Payer: Self-pay | Admitting: Internal Medicine

## 2016-06-13 VITALS — BP 125/86 | HR 68 | Resp 11 | Ht 70.0 in | Wt 211.0 lb

## 2016-06-13 DIAGNOSIS — Z800 Family history of malignant neoplasm of digestive organs: Secondary | ICD-10-CM | POA: Diagnosis not present

## 2016-06-13 DIAGNOSIS — Z86010 Personal history of colonic polyps: Secondary | ICD-10-CM | POA: Diagnosis not present

## 2016-06-13 LAB — GLUCOSE, CAPILLARY
GLUCOSE-CAPILLARY: 112 mg/dL — AB (ref 65–99)
Glucose-Capillary: 104 mg/dL — ABNORMAL HIGH (ref 65–99)

## 2016-06-13 LAB — HM COLONOSCOPY

## 2016-06-13 MED ORDER — SODIUM CHLORIDE 0.9 % IV SOLN: 500.0000 mL | INTRAVENOUS | Status: DC

## 2016-06-13 NOTE — Progress Notes (Signed)
A/ox3 pleased with MAC, report to Penny RN 

## 2016-06-13 NOTE — Op Note (Signed)
[place] Patient Name: Marcus Schultz Procedure Date: 06/13/2016 7:33 AM MRN: [id number] Endoscopist: Gatha Mayer , MD Age: 40 Referring MD:  Date of Birth: 1976-09-10 Gender: Male Account #: [id number] Procedure:                Colonoscopy Indications:              High risk colon cancer surveillance: Personal                            history of colonic polyps Medicines:                Propofol per Anesthesia, Monitored Anesthesia Care Procedure:                Pre-Anesthesia Assessment:                           - Prior to the procedure, a History and Physical                            was performed, and patient medications and                            allergies were reviewed. The patient's tolerance of                            previous anesthesia was also reviewed. The risks                            and benefits of the procedure and the sedation                            options and risks were discussed with the patient.                            All questions were answered, and informed consent                            was obtained. Prior Anticoagulants: The patient has                            taken no previous anticoagulant or antiplatelet                            agents. ASA Grade Assessment: II - A patient with                            mild systemic disease. After reviewing the risks                            and benefits, the patient was deemed in                            satisfactory condition to undergo the procedure.  After obtaining informed consent, the colonoscope                            was passed under direct vision. Throughout the                            procedure, the patient's blood pressure, pulse, and                            oxygen saturations were monitored continuously. The                            Model CF-HQ190L [id number]) scope was introduced                            through the anus  and advanced to the the cecum,                            identified by appendiceal orifice and ileocecal                            valve. The quality of the bowel preparation was                            excellent. The colonoscopy was performed without                            difficulty. The patient tolerated the procedure                            well. The bowel preparation used was Miralax. The                            ileocecal valve, appendiceal orifice, and rectum                            were photographed. Scope In: 8:04:34 AM Scope Out: 8:16:53 AM Total Procedure Duration: 0 hours 12 minutes 19 seconds  Findings:                 The perianal and digital rectal examinations were                            normal. Pertinent negatives include normal prostate                            (size, shape, and consistency).                           The colon (entire examined portion) appeared normal.                           No additional abnormalities were found on  retroflexion. Complications:            No immediate complications. Estimated blood loss:                            None. Estimated Blood Loss:     Estimated blood loss: none. Impression:               - The entire examined colon is normal.                           - No specimens collected.                           - Family hx colon cancer father at 97 and personal                            hx adenoma and sessile serrated adenoma 2012 Recommendation:           - Repeat colonoscopy in 5 years for surveillance.                           - Patient has a contact number available for                            emergencies. The signs and symptoms of potential                            delayed complications were discussed with the                            patient. Return to normal activities tomorrow.                            Written discharge instructions were provided to the                             patient.                           - Resume previous diet.                           - Continue present medications.                           - Patient has a contact number available for                            emergencies. The signs and symptoms of potential                            delayed complications were discussed with the                            patient. Return to normal activities tomorrow.  Written discharge instructions were provided to the                            patient. Gatha Mayer, MD 06/13/2016 8:26:27 AM This report has been signed electronically.

## 2016-06-13 NOTE — Patient Instructions (Addendum)
   No polyps today   Your next routine colonoscopy should be in 5 years - 2022.  I appreciate the opportunity to care for you. Gatha Mayer, MD, FACG   YOU HAD AN ENDOSCOPIC PROCEDURE TODAY AT [facility]:   Refer to the procedure report that was given to you for any specific questions about what was found during the examination.  If the procedure report does not answer your questions, please call your gastroenterologist to clarify.  If you requested that your care partner not be given the details of your procedure findings, then the procedure report has been included in a sealed envelope for you to review at your convenience later.  YOU SHOULD EXPECT: Some feelings of bloating in the abdomen. Passage of more gas than usual.  Walking can help get rid of the air that was put into your GI tract during the procedure and reduce the bloating. If you had a lower endoscopy (such as a colonoscopy or flexible sigmoidoscopy) you may notice spotting of blood in your stool or on the toilet paper. If you underwent a bowel prep for your procedure, you may not have a normal bowel movement for a few days.  Please Note:  You might notice some irritation and congestion in your nose or some drainage.  This is from the oxygen used during your procedure.  There is no need for concern and it should clear up in a day or so.  SYMPTOMS TO REPORT IMMEDIATELY:   Following lower endoscopy (colonoscopy or flexible sigmoidoscopy):  Excessive amounts of blood in the stool  Significant tenderness or worsening of abdominal pains  Swelling of the abdomen that is new, acute  Fever of 100F or higher    For urgent or emergent issues, a gastroenterologist can be reached at any hour by calling [phone].   DIET:  We do recommend a small meal at first, but then you may proceed to your regular diet.  Drink plenty of fluids but you should avoid alcoholic beverages for 24 hours.  ACTIVITY:  You  should plan to take it easy for the rest of today and you should NOT DRIVE or use heavy machinery until tomorrow (because of the sedation medicines used during the test).    FOLLOW UP: Our staff will call the number listed on your records the next business day following your procedure to check on you and address any questions or concerns that you may have regarding the information given to you following your procedure. If we do not reach you, we will leave a message.  However, if you are feeling well and you are not experiencing any problems, there is no need to return our call.  We will assume that you have returned to your regular daily activities without incident.  If any biopsies were taken you will be contacted by phone or by letter within the next 1-3 weeks.  Please call us at [phone] if you have not heard about the biopsies in 3 weeks.    SIGNATURES/CONFIDENTIALITY: You and/or your care partner have signed paperwork which will be entered into your electronic medical record.  These signatures attest to the fact that that the information above on your After Visit Summary has been reviewed and is understood.  Full responsibility of the confidentiality of this discharge information lies with you and/or your care-partner.

## 2016-06-16 ENCOUNTER — Telehealth: Payer: Self-pay | Admitting: *Deleted

## 2016-06-16 NOTE — Telephone Encounter (Signed)
  Follow up Call-  Call back number 06/13/2016  Post procedure Call Back phone  # [phone]  Permission to leave phone message Yes  Some recent data might be hidden     Patient questions:  Do you have a fever, pain , or abdominal swelling? No. Pain Score  0 *  Have you tolerated food without any problems? Yes.    Have you been able to return to your normal activities? Yes.    Do you have any questions about your discharge instructions: Diet   No. Medications  No. Follow up visit  No.  Do you have questions or concerns about your Care? No.  Actions: * If pain score is 4 or above: No action needed, pain <4.

## 2016-07-01 ENCOUNTER — Other Ambulatory Visit: Payer: Self-pay | Admitting: Internal Medicine

## 2016-07-01 DIAGNOSIS — E119 Type 2 diabetes mellitus without complications: Secondary | ICD-10-CM

## 2016-07-02 ENCOUNTER — Other Ambulatory Visit: Payer: Self-pay | Admitting: Internal Medicine

## 2016-07-09 ENCOUNTER — Ambulatory Visit (INDEPENDENT_AMBULATORY_CARE_PROVIDER_SITE_OTHER): Payer: BLUE CROSS/BLUE SHIELD | Admitting: Internal Medicine

## 2016-07-09 ENCOUNTER — Other Ambulatory Visit (INDEPENDENT_AMBULATORY_CARE_PROVIDER_SITE_OTHER): Payer: BLUE CROSS/BLUE SHIELD

## 2016-07-09 ENCOUNTER — Encounter: Payer: Self-pay | Admitting: Internal Medicine

## 2016-07-09 VITALS — BP 130/80 | HR 87 | Temp 98.3°F | Resp 16 | Ht 70.0 in | Wt 222.0 lb

## 2016-07-09 DIAGNOSIS — E349 Endocrine disorder, unspecified: Secondary | ICD-10-CM | POA: Diagnosis not present

## 2016-07-09 DIAGNOSIS — Z23 Encounter for immunization: Secondary | ICD-10-CM | POA: Diagnosis not present

## 2016-07-09 DIAGNOSIS — R7989 Other specified abnormal findings of blood chemistry: Secondary | ICD-10-CM

## 2016-07-09 DIAGNOSIS — E291 Testicular hypofunction: Secondary | ICD-10-CM | POA: Diagnosis not present

## 2016-07-09 DIAGNOSIS — E785 Hyperlipidemia, unspecified: Secondary | ICD-10-CM | POA: Diagnosis not present

## 2016-07-09 DIAGNOSIS — E119 Type 2 diabetes mellitus without complications: Secondary | ICD-10-CM

## 2016-07-09 DIAGNOSIS — J988 Other specified respiratory disorders: Secondary | ICD-10-CM

## 2016-07-09 LAB — CBC WITH DIFFERENTIAL/PLATELET
Basophils Absolute: 0 K/uL (ref 0.0–0.1)
Basophils Relative: 0.4 % (ref 0.0–3.0)
Eosinophils Absolute: 0.2 K/uL (ref 0.0–0.7)
Eosinophils Relative: 3.3 % (ref 0.0–5.0)
HCT: 43.3 % (ref 39.0–52.0)
Hemoglobin: 14.4 g/dL (ref 13.0–17.0)
Lymphocytes Relative: 32.1 % (ref 12.0–46.0)
Lymphs Abs: 2.4 K/uL (ref 0.7–4.0)
MCHC: 33.3 g/dL (ref 30.0–36.0)
MCV: 77 fl — ABNORMAL LOW (ref 78.0–100.0)
Monocytes Absolute: 0.7 K/uL (ref 0.1–1.0)
Monocytes Relative: 9.3 % (ref 3.0–12.0)
Neutro Abs: 4.1 K/uL (ref 1.4–7.7)
Neutrophils Relative %: 54.9 % (ref 43.0–77.0)
Platelets: 300 K/uL (ref 150.0–400.0)
RBC: 5.62 Mil/uL (ref 4.22–5.81)
RDW: 13.7 % (ref 11.5–15.5)
WBC: 7.4 K/uL (ref 4.0–10.5)

## 2016-07-09 LAB — COMPREHENSIVE METABOLIC PANEL WITH GFR
ALT: 54 U/L — ABNORMAL HIGH (ref 0–53)
AST: 34 U/L (ref 0–37)
Albumin: 4 g/dL (ref 3.5–5.2)
Alkaline Phosphatase: 64 U/L (ref 39–117)
BUN: 12 mg/dL (ref 6–23)
CO2: 30 meq/L (ref 19–32)
Calcium: 8.5 mg/dL (ref 8.4–10.5)
Chloride: 103 meq/L (ref 96–112)
Creatinine, Ser: 0.84 mg/dL (ref 0.40–1.50)
GFR: 107.69 mL/min
Glucose, Bld: 142 mg/dL — ABNORMAL HIGH (ref 70–99)
Potassium: 4.4 meq/L (ref 3.5–5.1)
Sodium: 138 meq/L (ref 135–145)
Total Bilirubin: 0.5 mg/dL (ref 0.2–1.2)
Total Protein: 6.9 g/dL (ref 6.0–8.3)

## 2016-07-09 LAB — TSH: TSH: 1.12 u[IU]/mL (ref 0.35–4.50)

## 2016-07-09 LAB — LIPID PANEL
Cholesterol: 188 mg/dL (ref 0–200)
HDL: 33.3 mg/dL — ABNORMAL LOW
NonHDL: 154.33
Total CHOL/HDL Ratio: 6
Triglycerides: 237 mg/dL — ABNORMAL HIGH (ref 0.0–149.0)
VLDL: 47.4 mg/dL — ABNORMAL HIGH (ref 0.0–40.0)

## 2016-07-09 LAB — LDL CHOLESTEROL, DIRECT: LDL DIRECT: 134 mg/dL

## 2016-07-09 LAB — HEMOGLOBIN A1C: Hgb A1c MFr Bld: 7.1 % — ABNORMAL HIGH (ref 4.6–6.5)

## 2016-07-09 MED ORDER — CEFDINIR 300 MG PO CAPS: 300.0000 mg | ORAL_CAPSULE | Freq: Two times a day (BID) | ORAL | 1 refills | Status: DC

## 2016-07-09 MED ORDER — TESTOSTERONE CYPIONATE 200 MG/ML IM SOLN: INTRAMUSCULAR | 0 refills | Status: DC

## 2016-07-09 NOTE — Patient Instructions (Signed)

## 2016-07-09 NOTE — Progress Notes (Signed)
Subjective:  Patient ID: Marcus Schultz, male    DOB: [birth year]  Age: 40 y.o. MRN: [id number]  CC: Cough and Diabetes   HPI Marcus Schultz presents for a six-day history of cough productive of yellow phlegm with chills. He denies fever, sore throat, night sweats, hemoptysis, wheezing, or shortness of breath.  Outpatient Medications Prior to Visit  Medication Sig Dispense Refill  . Blood Glucose Monitoring Suppl (ONETOUCH VERIO FLEX SYSTEM) w/Device KIT 1 Act by Does not apply route 3 (three) times daily with meals. 2 kit 0  . doxycycline (MONODOX) 100 MG capsule   0  . glucose blood test strip 1 each by Other route 2 (two) times daily. Use to check blood sugars twice a day Dx E11.9 100 each 12  . KOMBIGLYZE XR 02-999 MG TB24 TAKE 1 TABLET BY MOUTH DAILY 90 tablet 0  . ranitidine (ZANTAC) 150 MG tablet Take 1 tablet (150 mg total) by mouth 2 (two) times daily. 60 tablet 0  . tadalafil (CIALIS) 5 MG tablet Take 1 tablet (5 mg total) by mouth daily as needed for erectile dysfunction. 10 tablet 11  . testosterone cypionate (DEPOTESTOSTERONE CYPIONATE) 200 MG/ML injection INJECT 1 ML IN THE MUSCLE EVERY 14 DAYS 10 mL 0   Facility-Administered Medications Prior to Visit  Medication Dose Route Frequency Provider Last Rate Last Dose  . 0.9 %  sodium chloride infusion  500 mL Intravenous Continuous Gatha Mayer, MD        ROS Review of Systems  Constitutional: Positive for chills. Negative for diaphoresis, fatigue and fever.  HENT: Negative.   Eyes: Negative.  Negative for visual disturbance.  Respiratory: Positive for cough. Negative for choking, chest tightness, shortness of breath, wheezing and stridor.   Cardiovascular: Negative.  Negative for chest pain, palpitations and leg swelling.  Gastrointestinal: Negative.  Negative for abdominal pain, constipation, diarrhea, nausea and vomiting.  Endocrine: Negative.   Genitourinary: Negative.   Musculoskeletal: Negative for arthralgias, back  pain, myalgias and neck pain.  Skin: Negative.  Negative for color change and rash.  Allergic/Immunologic: Negative.   Neurological: Negative.  Negative for dizziness, weakness and light-headedness.  Hematological: Negative for adenopathy. Does not bruise/bleed easily.  Psychiatric/Behavioral: Negative.     Objective:  BP 130/80 (BP Location: Left Arm, Patient Position: Sitting, Cuff Size: Normal)   Pulse 87   Temp 98.3 F (36.8 C) (Oral)   Resp 16   Ht '5\' 10"'$  (1.778 m)   Wt 222 lb (100.7 kg)   SpO2 97%   BMI 31.85 kg/m   BP Readings from Last 3 Encounters:  07/09/16 130/80  06/13/16 125/86  01/10/16 100/78    Wt Readings from Last 3 Encounters:  07/09/16 222 lb (100.7 kg)  06/13/16 211 lb (95.7 kg)  05/30/16 211 lb (95.7 kg)    Physical Exam  Constitutional: He is oriented to person, place, and time. No distress.  HENT:  Mouth/Throat: Oropharynx is clear and moist. No oropharyngeal exudate.  Eyes: Conjunctivae are normal. Right eye exhibits no discharge. Left eye exhibits no discharge. No scleral icterus.  Neck: Normal range of motion. Neck supple. No JVD present. No tracheal deviation present. No thyromegaly present.  Cardiovascular: Normal rate, regular rhythm, normal heart sounds and intact distal pulses.  Exam reveals no gallop and no friction rub.   No murmur heard. Pulmonary/Chest: Effort normal and breath sounds normal. No stridor. No respiratory distress. He has no wheezes. He has no rales. He exhibits no tenderness.  Abdominal:  Soft. Bowel sounds are normal. He exhibits no distension and no mass. There is no tenderness. There is no rebound and no guarding.  Musculoskeletal: Normal range of motion. He exhibits no edema, tenderness or deformity.  Lymphadenopathy:    He has no cervical adenopathy.  Neurological: He is oriented to person, place, and time.  Skin: Skin is warm and dry. No rash noted. He is not diaphoretic. No erythema. No pallor.  Vitals  reviewed.   Lab Results  Component Value Date   WBC 7.4 07/09/2016   HGB 14.4 07/09/2016   HCT 43.3 07/09/2016   PLT 300.0 07/09/2016   GLUCOSE 142 (H) 07/09/2016   CHOL 188 07/09/2016   TRIG 237.0 (H) 07/09/2016   HDL 33.30 (L) 07/09/2016   LDLDIRECT 134.0 07/09/2016   LDLCALC 111 (H) 09/10/2015   ALT 54 (H) 07/09/2016   AST 34 07/09/2016   NA 138 07/09/2016   K 4.4 07/09/2016   CL 103 07/09/2016   CREATININE 0.84 07/09/2016   BUN 12 07/09/2016   CO2 30 07/09/2016   TSH 1.12 07/09/2016   PSA 0.91 10/13/2013   HGBA1C 7.1 (H) 07/09/2016   MICROALBUR <0.7 09/10/2015    Dg Chest 2 View  Result Date: 01/02/2016 CLINICAL DATA:  Cough. EXAM: CHEST  2 VIEW COMPARISON:  09/10/2015 FINDINGS: Normal heart size and mediastinal contours. No acute infiltrate or edema. No effusion or pneumothorax. No acute osseous findings. IMPRESSION: Negative chest. Electronically Signed   By: Marnee Spring M.D.   On: 01/02/2016 08:08    Assessment & Plan:   Marcus Schultz was seen today for cough and diabetes.  Diagnoses and all orders for this visit:  Need for prophylactic vaccination and inoculation against influenza -     Flu Vaccine QUAD 36+ mos IM  Hypogonadism male- I will recheck his testosterone level to be certain it's in the normal range and will screen his lab work for complications such as erythrocytosis and elevated liver enzymes. -     Comprehensive metabolic panel; Future -     CBC with Differential/Platelet; Future  Low testosterone- as above -     Testosterone Total,Free,Bio, Males; Future -     testosterone cypionate (DEPOTESTOSTERONE CYPIONATE) 200 MG/ML injection; INJECT 1 ML IN THE MUSCLE EVERY 14 DAYS  Hyperlipidemia with target LDL less than 130- He has not achieved his LDL goal, he is a type II diabetic but is not 40 yet, during his next visit I will discuss whether or not he is willing to start statin therapy. -     Lipid panel; Future -     TSH; Future  Diabetes mellitus  type 2 in nonobese Marcus Schultz)- his A1c is 7.1%, his blood sugars are adequately well-controlled. -     Comprehensive metabolic panel; Future -     Hemoglobin A1c; Future  RTI (respiratory tract infection) -     cefdinir (OMNICEF) 300 MG capsule; Take 1 capsule (300 mg total) by mouth 2 (two) times daily.   I am having Marcus Schultz start on cefdinir. I am also having him maintain his tadalafil, ranitidine, doxycycline, ONETOUCH VERIO FLEX SYSTEM, glucose blood, KOMBIGLYZE XR, and testosterone cypionate. We will continue to administer sodium chloride.  Meds ordered this encounter  Medications  . cefdinir (OMNICEF) 300 MG capsule    Sig: Take 1 capsule (300 mg total) by mouth 2 (two) times daily.    Dispense:  20 capsule    Refill:  1  . testosterone cypionate (DEPOTESTOSTERONE CYPIONATE) 200  MG/ML injection    Sig: INJECT 1 ML IN THE MUSCLE EVERY 14 DAYS    Dispense:  10 mL    Refill:  0     Follow-up: Return in about 3 weeks (around 07/30/2016).  Scarlette Calico, MD

## 2016-07-09 NOTE — Progress Notes (Signed)
Pre visit review using our clinic review tool, if applicable. No additional management support is needed unless otherwise documented below in the visit note. 

## 2016-07-10 LAB — TESTOSTERONE TOTAL,FREE,BIO, MALES
ALBUMIN: 4.2 g/dL (ref 3.6–5.1)
SEX HORMONE BINDING: 21 nmol/L (ref 10–50)
TESTOSTERONE BIOAVAILABLE: 113.9 ng/dL (ref 110.0–575.0)
TESTOSTERONE: 320 ng/dL (ref 250–827)
Testosterone, Free: 59.1 pg/mL (ref 46.0–224.0)

## 2016-07-14 ENCOUNTER — Encounter: Payer: Self-pay | Admitting: Internal Medicine

## 2016-07-14 ENCOUNTER — Other Ambulatory Visit: Payer: Self-pay | Admitting: Internal Medicine

## 2016-07-14 ENCOUNTER — Telehealth: Payer: Self-pay | Admitting: Internal Medicine

## 2016-07-14 DIAGNOSIS — J988 Other specified respiratory disorders: Secondary | ICD-10-CM

## 2016-07-14 MED ORDER — AZITHROMYCIN 500 MG PO TABS: 500.0000 mg | ORAL_TABLET | Freq: Every day | ORAL | 0 refills | Status: DC

## 2016-07-14 NOTE — Telephone Encounter (Signed)
Tried to contact pt but number listed was not a working number.

## 2016-07-14 NOTE — Telephone Encounter (Signed)
cefdinir (OMNICEF) 300 MG capsule  Patient is requesting this medication in a tablet. He states he can not take it in a capsule. He also states he did not pick it up from the pharmacy. Please follow up, Thank you.

## 2016-07-14 NOTE — Telephone Encounter (Signed)
Please advise 

## 2016-07-14 NOTE — Telephone Encounter (Signed)
changed

## 2016-08-07 ENCOUNTER — Ambulatory Visit (INDEPENDENT_AMBULATORY_CARE_PROVIDER_SITE_OTHER): Payer: BLUE CROSS/BLUE SHIELD | Admitting: Nurse Practitioner

## 2016-08-07 ENCOUNTER — Encounter: Payer: Self-pay | Admitting: Nurse Practitioner

## 2016-08-07 VITALS — BP 142/98 | HR 68 | Temp 97.4°F | Ht 70.0 in | Wt 221.0 lb

## 2016-08-07 DIAGNOSIS — J069 Acute upper respiratory infection, unspecified: Secondary | ICD-10-CM

## 2016-08-07 MED ORDER — METHYLPREDNISOLONE ACETATE 40 MG/ML IJ SUSP
40.0000 mg | Freq: Once | INTRAMUSCULAR | Status: AC
  Administered 2016-08-07: 40 mg via INTRAMUSCULAR

## 2016-08-07 MED ORDER — FLUTICASONE PROPIONATE 50 MCG/ACT NA SUSP: 2.0000 | Freq: Every day | NASAL | 0 refills | Status: DC

## 2016-08-07 MED ORDER — HYDROCODONE-HOMATROPINE 5-1.5 MG/5ML PO SYRP: 5.0000 mL | ORAL_SOLUTION | Freq: Every evening | ORAL | 0 refills | Status: DC | PRN

## 2016-08-07 MED ORDER — OXYMETAZOLINE HCL 0.05 % NA SOLN: 1.0000 | Freq: Two times a day (BID) | NASAL | 0 refills | Status: DC

## 2016-08-07 MED ORDER — ALBUTEROL SULFATE HFA 108 (90 BASE) MCG/ACT IN AERS: 1.0000 | INHALATION_SPRAY | Freq: Four times a day (QID) | RESPIRATORY_TRACT | 0 refills | Status: DC | PRN

## 2016-08-07 MED ORDER — GUAIFENESIN-DM 100-10 MG/5ML PO SYRP: 10.0000 mL | ORAL_SOLUTION | ORAL | 0 refills | Status: DC | PRN

## 2016-08-07 NOTE — Patient Instructions (Addendum)
URI Instructions: Flonase and Afrin use: apply 1spray of afrin in each nare, wait 64mins, then apply 2sprays of flonase in each nare. Use both nasal spray consecutively x 3days, then flonase only for at least 14days.  Use over-the-counter  "cold" medicines  such as "Tylenol cold" , "Advil cold",  "Mucinex" or" Mucinex D"  for cough and congestion.  Avoid decongestants if you have high blood pressure. Use" Delsym" or" Robitussin" cough syrup varietis for cough.  You can use plain "Tylenol" or "Advi"l for fever, chills and achyness.   "Common cold" symptoms are usually triggered by a virus.  The antibiotics are usually not necessary. On average, a" viral cold" illness would take 4-7 days to resolve. Please, make an appointment if you are not better or if you're worse.  Call office for oral abx prescription if no improvement in 7days.

## 2016-08-07 NOTE — Progress Notes (Signed)
Pre visit review using our clinic review tool, if applicable. No additional management support is needed unless otherwise documented below in the visit note. 

## 2016-08-07 NOTE — Progress Notes (Signed)
Subjective:  Patient ID: Marcus Schultz, male    DOB: [birth year]  Age: 40 y.o. MRN: [id number]  CC: Sore Throat (Pt stated having sore throat, fever, chill  for about 2 days)   Sore Throat   This is a new problem. The current episode started yesterday. The problem has been gradually worsening. The maximum temperature recorded prior to his arrival was 100.4 - 100.9 F. Associated symptoms include congestion, coughing, ear pain, headaches, a hoarse voice, a plugged ear sensation and swollen glands. Pertinent negatives include no ear discharge, shortness of breath, stridor, trouble swallowing or vomiting. He has had no exposure to strep or mono. He has tried acetaminophen and NSAIDs for the symptoms. The treatment provided no relief.    Outpatient Medications Prior to Visit  Medication Sig Dispense Refill  . Blood Glucose Monitoring Suppl (ONETOUCH VERIO FLEX SYSTEM) w/Device KIT 1 Act by Does not apply route 3 (three) times daily with meals. 2 kit 0  . glucose blood test strip 1 each by Other route 2 (two) times daily. Use to check blood sugars twice a day Dx E11.9 100 each 12  . KOMBIGLYZE XR 02-999 MG TB24 TAKE 1 TABLET BY MOUTH DAILY 90 tablet 0  . ranitidine (ZANTAC) 150 MG tablet Take 1 tablet (150 mg total) by mouth 2 (two) times daily. 60 tablet 0  . tadalafil (CIALIS) 5 MG tablet Take 1 tablet (5 mg total) by mouth daily as needed for erectile dysfunction. 10 tablet 11  . testosterone cypionate (DEPOTESTOSTERONE CYPIONATE) 200 MG/ML injection INJECT 1 ML IN THE MUSCLE EVERY 14 DAYS 10 mL 0  . azithromycin (ZITHROMAX) 500 MG tablet Take 1 tablet (500 mg total) by mouth daily. 3 tablet 0  . doxycycline (MONODOX) 100 MG capsule   0   Facility-Administered Medications Prior to Visit  Medication Dose Route Frequency Provider Last Rate Last Dose  . 0.9 %  sodium chloride infusion  500 mL Intravenous Continuous Gatha Mayer, MD        ROS See HPI  Objective:  BP (!) 142/98 (BP  Location: Left Arm, Patient Position: Sitting, Cuff Size: Normal)   Pulse 68   Temp 97.4 F (36.3 C)   Ht _0  (1.778 m)   Wt 221 lb (100.2 kg)   SpO2 98%   BMI 31.71 kg/m   BP Readings from Last 3 Encounters:  08/07/16 (!) 142/98  07/09/16 130/80  06/13/16 125/86    Wt Readings from Last 3 Encounters:  08/07/16 221 lb (100.2 kg)  07/09/16 222 lb (100.7 kg)  06/13/16 211 lb (95.7 kg)    Physical Exam  Constitutional: He is oriented to person, place, and time. No distress.  HENT:  Right Ear: Tympanic membrane, external ear and ear canal normal.  Left Ear: Tympanic membrane and ear canal normal.  Nose: Mucosal edema and rhinorrhea present. Right sinus exhibits maxillary sinus tenderness and frontal sinus tenderness. Left sinus exhibits maxillary sinus tenderness and frontal sinus tenderness.  Mouth/Throat: Uvula is midline. Posterior oropharyngeal erythema present. No oropharyngeal exudate.  Eyes: No scleral icterus.  Neck: Normal range of motion. Neck supple.  Cardiovascular: Normal rate and regular rhythm.   Pulmonary/Chest: Effort normal and breath sounds normal.  Lymphadenopathy:    He has cervical adenopathy.  Neurological: He is alert and oriented to person, place, and time.  Vitals reviewed.   Lab Results  Component Value Date   WBC 7.4 07/09/2016   HGB 14.4 07/09/2016   HCT 43.3 07/09/2016  PLT 300.0 07/09/2016   GLUCOSE 142 (H) 07/09/2016   CHOL 188 07/09/2016   TRIG 237.0 (H) 07/09/2016   HDL 33.30 (L) 07/09/2016   LDLDIRECT 134.0 07/09/2016   LDLCALC 111 (H) 09/10/2015   ALT 54 (H) 07/09/2016   AST 34 07/09/2016   NA 138 07/09/2016   K 4.4 07/09/2016   CL 103 07/09/2016   CREATININE 0.84 07/09/2016   BUN 12 07/09/2016   CO2 30 07/09/2016   TSH 1.12 07/09/2016   PSA 0.91 10/13/2013   HGBA1C 7.1 (H) 07/09/2016   MICROALBUR <0.7 09/10/2015    Dg Chest 2 View  Result Date: 01/02/2016 CLINICAL DATA:  Cough. EXAM: CHEST  2 VIEW COMPARISON:   09/10/2015 FINDINGS: Normal heart size and mediastinal contours. No acute infiltrate or edema. No effusion or pneumothorax. No acute osseous findings. IMPRESSION: Negative chest. Electronically Signed   By: Monte Fantasia M.D.   On: 01/02/2016 08:08    Assessment & Plan:   Marcus Schultz was seen today for sore throat.  Diagnoses and all orders for this visit:  Acute URI -     methylPREDNISolone acetate (DEPO-MEDROL) injection 40 mg; Inject 1 mL (40 mg total) into the muscle once. -     oxymetazoline (AFRIN NASAL SPRAY) 0.05 % nasal spray; Place 1 spray into both nostrils 2 (two) times daily. Use only for 3days, then stop -     fluticasone (FLONASE) 50 MCG/ACT nasal spray; Place 2 sprays into both nostrils daily. -     guaiFENesin-dextromethorphan (ROBITUSSIN DM) 100-10 MG/5ML syrup; Take 10 mLs by mouth every 4 (four) hours as needed for cough. -     albuterol (PROVENTIL HFA;VENTOLIN HFA) 108 (90 Base) MCG/ACT inhaler; Inhale 1-2 puffs into the lungs every 6 (six) hours as needed for wheezing or shortness of breath. -     HYDROcodone-homatropine (HYCODAN) 5-1.5 MG/5ML syrup; Take 5 mLs by mouth at bedtime as needed for cough.   I have discontinued Marcus Schultz's doxycycline and azithromycin. I am also having him start on oxymetazoline, fluticasone, guaiFENesin-dextromethorphan, albuterol, and HYDROcodone-homatropine. Additionally, I am having him maintain his tadalafil, ranitidine, ONETOUCH VERIO FLEX SYSTEM, glucose blood, KOMBIGLYZE XR, and testosterone cypionate. We administered methylPREDNISolone acetate. We will continue to administer sodium chloride.  Meds ordered this encounter  Medications  . methylPREDNISolone acetate (DEPO-MEDROL) injection 40 mg  . oxymetazoline (AFRIN NASAL SPRAY) 0.05 % nasal spray    Sig: Place 1 spray into both nostrils 2 (two) times daily. Use only for 3days, then stop    Dispense:  30 mL    Refill:  0    Order Specific Question:   Supervising Provider    Answer:    Cassandria Anger [[id number]]  . fluticasone (FLONASE) 50 MCG/ACT nasal spray    Sig: Place 2 sprays into both nostrils daily.    Dispense:  16 g    Refill:  0    Order Specific Question:   Supervising Provider    Answer:   Cassandria Anger [[id number]]  . guaiFENesin-dextromethorphan (ROBITUSSIN DM) 100-10 MG/5ML syrup    Sig: Take 10 mLs by mouth every 4 (four) hours as needed for cough.    Dispense:  118 mL    Refill:  0    Order Specific Question:   Supervising Provider    Answer:   Cassandria Anger [[id number]]  . albuterol (PROVENTIL HFA;VENTOLIN HFA) 108 (90 Base) MCG/ACT inhaler    Sig: Inhale 1-2 puffs into the lungs every 6 (six) hours as  needed for wheezing or shortness of breath.    Dispense:  1 Inhaler    Refill:  0    Order Specific Question:   Supervising Provider    Answer:   Cassandria Anger [[id number]]  . HYDROcodone-homatropine (HYCODAN) 5-1.5 MG/5ML syrup    Sig: Take 5 mLs by mouth at bedtime as needed for cough.    Dispense:  120 mL    Refill:  0    Order Specific Question:   Supervising Provider    Answer:   Cassandria Anger [[id number]]    Follow-up: No Follow-up on file.  Wilfred Lacy, NP

## 2016-08-12 ENCOUNTER — Other Ambulatory Visit: Payer: Self-pay | Admitting: Nurse Practitioner

## 2016-08-12 ENCOUNTER — Telehealth: Payer: Self-pay | Admitting: Nurse Practitioner

## 2016-08-12 DIAGNOSIS — J069 Acute upper respiratory infection, unspecified: Secondary | ICD-10-CM

## 2016-08-12 MED ORDER — AMOXICILLIN-POT CLAVULANATE 875-125 MG PO TABS: 1.0000 | ORAL_TABLET | Freq: Two times a day (BID) | ORAL | 0 refills | Status: DC

## 2016-08-12 NOTE — Telephone Encounter (Signed)
Pt stating that Marcus Schultz told him if he doesn't feel better to call so we can send him in something. He is not better. Please advise.

## 2016-08-12 NOTE — Telephone Encounter (Signed)
Called Pt. About new Rx with [facility] instruction.

## 2016-08-12 NOTE — Telephone Encounter (Signed)
Prescription sent

## 2016-08-12 NOTE — Telephone Encounter (Signed)
Routing to Marcus Schultz please advice, thanks.

## 2016-09-04 ENCOUNTER — Ambulatory Visit (INDEPENDENT_AMBULATORY_CARE_PROVIDER_SITE_OTHER)
Admission: RE | Admit: 2016-09-04 | Discharge: 2016-09-04 | Disposition: A | Discharge status: Home / Self Care | Payer: BLUE CROSS/BLUE SHIELD | Source: Ambulatory Visit | Attending: Internal Medicine | Admitting: Internal Medicine

## 2016-09-04 ENCOUNTER — Encounter: Payer: Self-pay | Admitting: Internal Medicine

## 2016-09-04 ENCOUNTER — Ambulatory Visit (INDEPENDENT_AMBULATORY_CARE_PROVIDER_SITE_OTHER): Payer: BLUE CROSS/BLUE SHIELD | Admitting: Internal Medicine

## 2016-09-04 VITALS — BP 142/86 | HR 75 | Temp 98.1°F | Resp 16 | Ht 70.0 in | Wt 223.0 lb

## 2016-09-04 DIAGNOSIS — F524 Premature ejaculation: Secondary | ICD-10-CM

## 2016-09-04 DIAGNOSIS — M25561 Pain in right knee: Secondary | ICD-10-CM

## 2016-09-04 MED ORDER — HYDROCODONE-ACETAMINOPHEN 5-325 MG PO TABS: 1.0000 | ORAL_TABLET | Freq: Four times a day (QID) | ORAL | 0 refills | Status: AC | PRN

## 2016-09-04 MED ORDER — PAROXETINE HCL 10 MG PO TABS: 10.0000 mg | ORAL_TABLET | Freq: Every day | ORAL | 3 refills | Status: AC

## 2016-09-04 NOTE — Patient Instructions (Signed)
Knee Sprain, Adult A knee sprain is a stretch or tear in a knee ligament. Knee ligaments are bands of tissue that connect bones in the knee to each other. What are the causes? This condition often results from:  A fall.  An injury to the knee.  What are the signs or symptoms? Symptoms of this condition include:  Trouble bending the leg.  Swelling in the knee.  Bruising around the knee.  Tenderness or pain in the knee.  Muscle spasms around the knee.  How is this diagnosed? This condition may be diagnosed based on:  A physical exam.  What happened just before you started to have symptoms.  Tests, including: ? An X-ray. This may be done to make sure no bones are broken. ? An MRI. This may be done to check if the ligament is torn. ? Stress testing of the knee. This may be done to check ligament damage.  How is this treated? Treatment for this condition may involve:  Keeping the knee still (immobilized) with a cast, brace, or splint.  Applying ice to the knee. This helps with pain and swelling.  Keeping the knee raised (elevated) above the level of your heart when you are resting. This helps with pain and swelling.  Taking medicine for pain.  Exercises to prevent or limit permanent weakness or stiffness in your knee.  Surgery to reconnect the ligament to the bone or to reconstruct it. This may be needed if the ligament tore all the way.  Follow these instructions at home: If you have a splint or brace:  Wear the splint or brace as told by your health care provider. Remove it only as told by your health care provider.  Loosen the splint or brace if your toes tingle, become numb, or turn cold and blue.  Keep the splint or brace clean.  If the splint or brace is not waterproof: ? Do not let it get wet. ? Cover it with a watertight covering when you take a bath or a shower. If you have a cast:  Do not stick anything inside the cast to scratch your skin. Doing  that increases your risk of infection.  Check the skin around the cast every day. Tell your health care provider about any concerns.  You may put lotion on dry skin around the edges of the cast. Do not put lotion on the skin underneath the cast.  Keep the cast clean.  If the cast is not waterproof: ? Do not let it get wet. ? Cover it with a watertight covering when you take a bath or a shower. Managing pain, stiffness, and swelling   If directed, put ice on the injured area. ? If you have a removable splint or brace, remove it as told by your health care provider. ? Put ice in a plastic bag. ? Place a towel between your skin and the bag or between your cast and the bag. ? Leave the ice on for 20 minutes, 2-3 times a day.  Gently move your toes often to avoid stiffness and to lessen swelling.  Elevate the injured area above the level of your heart while you are sitting or lying down.  Take over-the-counter and prescription medicines only as told by your health care provider. General instructions  Do exercises as told by your health care provider.  Keep all follow-up visits as told by your health care provider. This is important. Contact a health care provider if:  You have pain   that gets worse.  The cast, brace, or splint does not fit right.  The cast, brace, or splint gets damaged. Get help right away if:  You cannot use your injured joint to support any of your body weight (cannot bear weight).  You cannot move the injured joint.  You cannot walk more than a few steps without pain or without your knee buckling.  You have significant pain, swelling, or numbness below the cast, brace, or splint. This information is not intended to replace advice given to you by your health care provider. Make sure you discuss any questions you have with your health care provider. Document Released: 09/22/2005 Document Revised: 06/11/2016 Document Reviewed: 04/11/2016 [employer]  Interactive Patient Education  2017 [employer]  

## 2016-09-07 ENCOUNTER — Other Ambulatory Visit: Payer: Self-pay | Admitting: Internal Medicine

## 2016-09-07 DIAGNOSIS — R7989 Other specified abnormal findings of blood chemistry: Secondary | ICD-10-CM

## 2016-09-07 NOTE — Progress Notes (Signed)
Subjective:  Patient ID: Marcus Schultz, male    DOB: April 26, 1976  Age: 40 y.o. MRN: [id number]  CC: Knee Pain   HPI Marcus Schultz presents for a one-week history of non-traumatic right knee pain and swelling. He tells me the swelling has resolved over the last week but he still has pain over the lateral aspect of the right knee that radiates up his right thigh. He has tried Motrin for symptom relief but says there is still some discomfort.  He also complains of premature ejaculation and wants to know if there is a medication that will help with this.  Outpatient Medications Prior to Visit  Medication Sig Dispense Refill  . albuterol (PROVENTIL HFA;VENTOLIN HFA) 108 (90 Base) MCG/ACT inhaler Inhale 1-2 puffs into the lungs every 6 (six) hours as needed for wheezing or shortness of breath. 1 Inhaler 0  . Blood Glucose Monitoring Suppl (ONETOUCH VERIO FLEX SYSTEM) w/Device KIT 1 Act by Does not apply route 3 (three) times daily with meals. 2 kit 0  . fluticasone (FLONASE) 50 MCG/ACT nasal spray Place 2 sprays into both nostrils daily. 16 g 0  . glucose blood test strip 1 each by Other route 2 (two) times daily. Use to check blood sugars twice a day Dx E11.9 100 each 12  . KOMBIGLYZE XR 02-999 MG TB24 TAKE 1 TABLET BY MOUTH DAILY 90 tablet 0  . ranitidine (ZANTAC) 150 MG tablet Take 1 tablet (150 mg total) by mouth 2 (two) times daily. 60 tablet 0  . tadalafil (CIALIS) 5 MG tablet Take 1 tablet (5 mg total) by mouth daily as needed for erectile dysfunction. 10 tablet 11  . testosterone cypionate (DEPOTESTOSTERONE CYPIONATE) 200 MG/ML injection INJECT 1 ML IN THE MUSCLE EVERY 14 DAYS 10 mL 0  . guaiFENesin-dextromethorphan (ROBITUSSIN DM) 100-10 MG/5ML syrup Take 10 mLs by mouth every 4 (four) hours as needed for cough. 118 mL 0  . HYDROcodone-homatropine (HYCODAN) 5-1.5 MG/5ML syrup Take 5 mLs by mouth at bedtime as needed for cough. 120 mL 0  . oxymetazoline (AFRIN NASAL SPRAY) 0.05 % nasal  spray Place 1 spray into both nostrils 2 (two) times daily. Use only for 3days, then stop 30 mL 0  . amoxicillin-clavulanate (AUGMENTIN) 875-125 MG tablet Take 1 tablet by mouth 2 (two) times daily. 14 tablet 0  . 0.9 %  sodium chloride infusion      No facility-administered medications prior to visit.     ROS Review of Systems  Constitutional: Negative for chills and fever.  Eyes: Negative.   Respiratory: Negative.   Cardiovascular: Negative.   Gastrointestinal: Negative for abdominal pain.  Endocrine: Negative.   Genitourinary: Negative.   Musculoskeletal: Positive for arthralgias.  Neurological: Negative.   Hematological: Negative.   All other systems reviewed and are negative.   Objective:  BP (!) 142/86 (BP Location: Right Arm, Patient Position: Sitting, Cuff Size: Large)   Pulse 75   Temp 98.1 F (36.7 C) (Oral)   Resp 16   Ht '5\' 10"'$  (1.778 m)   Wt 223 lb (101.2 kg)   SpO2 98%   BMI 32.00 kg/m   BP Readings from Last 3 Encounters:  09/04/16 (!) 142/86  08/07/16 (!) 142/98  07/09/16 130/80    Wt Readings from Last 3 Encounters:  09/04/16 223 lb (101.2 kg)  08/07/16 221 lb (100.2 kg)  07/09/16 222 lb (100.7 kg)    Physical Exam  Musculoskeletal:       Right knee: He exhibits normal  range of motion, no swelling, no effusion, no ecchymosis, no deformity, no erythema, normal alignment, no LCL laxity, normal patellar mobility, no bony tenderness, normal meniscus and no MCL laxity. Tenderness found. Lateral joint line tenderness noted. No medial joint line, no MCL, no LCL and no patellar tendon tenderness noted.    Lab Results  Component Value Date   WBC 7.4 07/09/2016   HGB 14.4 07/09/2016   HCT 43.3 07/09/2016   PLT 300.0 07/09/2016   GLUCOSE 142 (H) 07/09/2016   CHOL 188 07/09/2016   TRIG 237.0 (H) 07/09/2016   HDL 33.30 (L) 07/09/2016   LDLDIRECT 134.0 07/09/2016   LDLCALC 111 (H) 09/10/2015   ALT 54 (H) 07/09/2016   AST 34 07/09/2016   NA 138  07/09/2016   K 4.4 07/09/2016   CL 103 07/09/2016   CREATININE 0.84 07/09/2016   BUN 12 07/09/2016   CO2 30 07/09/2016   TSH 1.12 07/09/2016   PSA 0.91 10/13/2013   HGBA1C 7.1 (H) 07/09/2016   MICROALBUR <0.7 09/10/2015    Dg Knee Complete 4 Views Right  Result Date: 09/04/2016 CLINICAL DATA:  Right knee pain for 7 days, no known injury EXAM: RIGHT KNEE - COMPLETE 4+ VIEW COMPARISON:  None. FINDINGS: Four views of the right knee submitted. No acute fracture or subluxation. Joint space is preserved. Small joint effusion. Mild spurring of patella. IMPRESSION: No acute fracture or subluxation. Small joint effusion. Mild spurring of patella. Electronically Signed   By: Lahoma Crocker M.D.   On: 09/04/2016 11:33    Assessment & Plan:   Carsin was seen today for knee pain.  Diagnoses and all orders for this visit:  Premature ejaculation, acquired, generalized, severe- I think Paxil may help with this, we'll started the Lotensin milligrams dose and will increase if indicated. -     PARoxetine (PAXIL) 10 MG tablet; Take 1 tablet (10 mg total) by mouth daily.  Acute pain of right knee- exam is concerning for some pathology over the lateral meniscus and plain film shows a small effusion. This may be a knee sprain but if it does not resolve soon then I will consider getting an MRI or referring him to orthopedics. For now will continue Norco in addition to ibuprofen for relief of the pain. -     DG Knee Complete 4 Views Right; Future -     HYDROcodone-acetaminophen (NORCO/VICODIN) 5-325 MG tablet; Take 1 tablet by mouth every 6 (six) hours as needed for moderate pain.   I have discontinued Mr. Berrones's oxymetazoline, guaiFENesin-dextromethorphan, HYDROcodone-homatropine, and amoxicillin-clavulanate. I am also having him start on PARoxetine and HYDROcodone-acetaminophen. Additionally, I am having him maintain his tadalafil, ranitidine, ONETOUCH VERIO FLEX SYSTEM, glucose blood, KOMBIGLYZE XR,  testosterone cypionate, fluticasone, and albuterol. We will stop administering sodium chloride.  Meds ordered this encounter  Medications  . PARoxetine (PAXIL) 10 MG tablet    Sig: Take 1 tablet (10 mg total) by mouth daily.    Dispense:  90 tablet    Refill:  3  . HYDROcodone-acetaminophen (NORCO/VICODIN) 5-325 MG tablet    Sig: Take 1 tablet by mouth every 6 (six) hours as needed for moderate pain.    Dispense:  25 tablet    Refill:  0     Follow-up: Return in about 3 weeks (around 09/25/2016).  Scarlette Calico, MD

## 2016-09-11 ENCOUNTER — Other Ambulatory Visit: Payer: Self-pay | Admitting: Internal Medicine

## 2016-09-19 ENCOUNTER — Telehealth: Payer: Self-pay

## 2016-09-19 NOTE — Telephone Encounter (Signed)
Received a Cover My Meds "key"  Entered key into cover my meds and sent to plan.   KEY: [id number]

## 2016-09-26 NOTE — Telephone Encounter (Signed)
Resubmitted PA - no response on the last one sumbitted.   New Key: [id number]

## 2016-10-02 NOTE — Telephone Encounter (Signed)
PA approved.  Faxed to pharmacy.  Pt informed of same.

## 2016-10-13 ENCOUNTER — Other Ambulatory Visit: Payer: Self-pay | Admitting: Nurse Practitioner

## 2016-10-13 ENCOUNTER — Other Ambulatory Visit: Payer: Self-pay | Admitting: Internal Medicine

## 2016-10-13 DIAGNOSIS — J069 Acute upper respiratory infection, unspecified: Secondary | ICD-10-CM

## 2016-10-13 DIAGNOSIS — R7989 Other specified abnormal findings of blood chemistry: Secondary | ICD-10-CM

## 2016-10-14 NOTE — Telephone Encounter (Signed)
Are you ok with refilling?---please advise, thanks

## 2016-10-14 NOTE — Telephone Encounter (Signed)
rx faxed to [facility] 

## 2016-11-12 ENCOUNTER — Encounter: Payer: Self-pay | Admitting: Internal Medicine

## 2016-11-12 ENCOUNTER — Ambulatory Visit (INDEPENDENT_AMBULATORY_CARE_PROVIDER_SITE_OTHER): Payer: BLUE CROSS/BLUE SHIELD | Admitting: Internal Medicine

## 2016-11-12 ENCOUNTER — Other Ambulatory Visit (INDEPENDENT_AMBULATORY_CARE_PROVIDER_SITE_OTHER): Payer: BLUE CROSS/BLUE SHIELD

## 2016-11-12 VITALS — BP 130/96 | HR 80 | Temp 97.7°F | Resp 16 | Ht 70.0 in | Wt 220.5 lb

## 2016-11-12 DIAGNOSIS — E781 Pure hyperglyceridemia: Secondary | ICD-10-CM | POA: Diagnosis not present

## 2016-11-12 DIAGNOSIS — E785 Hyperlipidemia, unspecified: Secondary | ICD-10-CM

## 2016-11-12 DIAGNOSIS — H9202 Otalgia, left ear: Secondary | ICD-10-CM

## 2016-11-12 DIAGNOSIS — I10 Essential (primary) hypertension: Secondary | ICD-10-CM

## 2016-11-12 DIAGNOSIS — E119 Type 2 diabetes mellitus without complications: Secondary | ICD-10-CM

## 2016-11-12 DIAGNOSIS — H538 Other visual disturbances: Secondary | ICD-10-CM

## 2016-11-12 LAB — URINALYSIS, ROUTINE W REFLEX MICROSCOPIC
Bilirubin Urine: NEGATIVE
Hgb urine dipstick: NEGATIVE
KETONES UR: NEGATIVE
Leukocytes, UA: NEGATIVE
Nitrite: NEGATIVE
PH: 8 (ref 5.0–8.0)
RBC / HPF: NONE SEEN (ref 0–?)
Specific Gravity, Urine: 1.01 (ref 1.000–1.030)
TOTAL PROTEIN, URINE-UPE24: NEGATIVE
URINE GLUCOSE: NEGATIVE
Urobilinogen, UA: 0.2 (ref 0.0–1.0)
WBC, UA: NONE SEEN (ref 0–?)

## 2016-11-12 LAB — COMPREHENSIVE METABOLIC PANEL
ALBUMIN: 4.4 g/dL (ref 3.5–5.2)
ALT: 53 U/L (ref 0–53)
AST: 31 U/L (ref 0–37)
Alkaline Phosphatase: 63 U/L (ref 39–117)
BUN: 14 mg/dL (ref 6–23)
CALCIUM: 9.2 mg/dL (ref 8.4–10.5)
CO2: 29 mEq/L (ref 19–32)
Chloride: 103 mEq/L (ref 96–112)
Creatinine, Ser: 0.9 mg/dL (ref 0.40–1.50)
GFR: 99.28 mL/min (ref >60.00)
Glucose, Bld: 119 mg/dL — ABNORMAL HIGH (ref 70–99)
POTASSIUM: 4.3 meq/L (ref 3.5–5.1)
Sodium: 138 mEq/L (ref 135–145)
Total Bilirubin: 0.4 mg/dL (ref 0.2–1.2)
Total Protein: 7.3 g/dL (ref 6.0–8.3)

## 2016-11-12 LAB — HEMOGLOBIN A1C: Hgb A1c MFr Bld: 6.8 % — ABNORMAL HIGH (ref 4.6–6.5)

## 2016-11-12 LAB — LIPID PANEL
CHOL/HDL RATIO: 6
Cholesterol: 201 mg/dL — ABNORMAL HIGH (ref 0–200)
HDL: 32.5 mg/dL — ABNORMAL LOW (ref >39.00)
LDL CALC: 144 mg/dL — AB (ref 0–99)
NONHDL: 168.97
Triglycerides: 123 mg/dL (ref 0.0–149.0)
VLDL: 24.6 mg/dL (ref 0.0–40.0)

## 2016-11-12 MED ORDER — TELMISARTAN 40 MG PO TABS: 40.0000 mg | ORAL_TABLET | Freq: Every day | ORAL | 1 refills | Status: DC

## 2016-11-12 MED ORDER — PITAVASTATIN CALCIUM 2 MG PO TABS: 1.0000 | ORAL_TABLET | Freq: Every day | ORAL | 3 refills | Status: DC

## 2016-11-12 NOTE — Progress Notes (Signed)
Pre visit review using our clinic review tool, if applicable. No additional management support is needed unless otherwise documented below in the visit note. 

## 2016-11-12 NOTE — Progress Notes (Signed)
Subjective:  Patient ID: Marcus Schultz, male    DOB: Feb 08, 1976  Age: 41 y.o. MRN: [id number]  CC: Diabetes; Hyperlipidemia; and Hypertension   HPI Marcus Schultz presents for follow-up on multiple medical problems. He complains of chronically blurred vision and wants to see an eye doctor. He complains of pain in his left ear for 10 days and wants to see an ear doctor. He tells me he thinks his blood sugars up and well controlled. He is concerned that his blood pressure has not been well controlled.  Outpatient Medications Prior to Visit  Medication Sig Dispense Refill  . albuterol (PROVENTIL HFA;VENTOLIN HFA) 108 (90 Base) MCG/ACT inhaler Inhale 1-2 puffs into the lungs every 6 (six) hours as needed for wheezing or shortness of breath. 1 Inhaler 0  . Blood Glucose Monitoring Suppl (ONETOUCH VERIO FLEX SYSTEM) w/Device KIT 1 Act by Does not apply route 3 (three) times daily with meals. 2 kit 0  . fluticasone (FLONASE) 50 MCG/ACT nasal spray SHAKE LIQUID AND USE 2 SPRAYS IN EACH NOSTRIL DAILY 16 g 11  . glucose blood test strip 1 each by Other route 2 (two) times daily. Use to check blood sugars twice a day Dx E11.9 100 each 12  . JANUMET XR 50-1000 MG TB24 TAKE 1 TABLET BY MOUTH DAILY 90 tablet 0  . PARoxetine (PAXIL) 10 MG tablet Take 1 tablet (10 mg total) by mouth daily. 90 tablet 3  . ranitidine (ZANTAC) 150 MG tablet Take 1 tablet (150 mg total) by mouth 2 (two) times daily. 60 tablet 0  . tadalafil (CIALIS) 5 MG tablet Take 1 tablet (5 mg total) by mouth daily as needed for erectile dysfunction. 10 tablet 11  . testosterone cypionate (DEPOTESTOSTERONE CYPIONATE) 200 MG/ML injection INJECT 1 ML IN THE MUSCLE EVERY 14 DAYS 10 mL 1  . KOMBIGLYZE XR 02-999 MG TB24 TAKE 1 TABLET BY MOUTH DAILY 90 tablet 0   No facility-administered medications prior to visit.     ROS Review of Systems  Constitutional: Negative.  Negative for chills, diaphoresis, fatigue and fever.  HENT: Positive for  ear pain. Negative for ear discharge, facial swelling, sinus pressure, sore throat, trouble swallowing and voice change.   Eyes: Negative.   Respiratory: Negative for cough, chest tightness, shortness of breath and wheezing.   Cardiovascular: Negative for chest pain, palpitations and leg swelling.  Gastrointestinal: Negative.  Negative for abdominal pain, blood in stool, constipation, diarrhea, nausea and vomiting.  Endocrine: Negative.  Negative for cold intolerance, heat intolerance, polydipsia, polyphagia and polyuria.  Genitourinary: Negative.   Musculoskeletal: Negative.  Negative for back pain and neck pain.  Skin: Negative.  Negative for color change and rash.  Allergic/Immunologic: Negative.   Neurological: Negative.  Negative for dizziness, weakness, light-headedness and headaches.  Hematological: Negative.  Negative for adenopathy.    Objective:  BP (!) 130/96 (BP Location: Left Arm, Patient Position: Sitting, Cuff Size: Normal)   Pulse 80   Temp 97.7 F (36.5 C) (Oral)   Resp 16   Ht '5\' 10"'$  (1.778 m)   Wt 220 lb 8 oz (100 kg)   SpO2 97%   BMI 31.64 kg/m   BP Readings from Last 3 Encounters:  11/12/16 (!) 130/96  09/04/16 (!) 142/86  08/07/16 (!) 142/98    Wt Readings from Last 3 Encounters:  11/12/16 220 lb 8 oz (100 kg)  09/04/16 223 lb (101.2 kg)  08/07/16 221 lb (100.2 kg)    Physical Exam  Constitutional: He  is oriented to person, place, and time. No distress.  HENT:  Right Ear: Hearing, tympanic membrane, external ear and ear canal normal.  Left Ear: Hearing, tympanic membrane, external ear and ear canal normal.  Eyes: Conjunctivae and EOM are normal. Pupils are equal, round, and reactive to light. Right eye exhibits no discharge. Left eye exhibits no discharge. No scleral icterus.  Neck: Normal range of motion. Neck supple. No JVD present. No tracheal deviation present. No thyromegaly present.  Cardiovascular: Normal rate, regular rhythm, normal heart  sounds and intact distal pulses.  Exam reveals no gallop and no friction rub.   No murmur heard. Pulmonary/Chest: Effort normal and breath sounds normal. No stridor. No respiratory distress. He has no wheezes. He has no rales. He exhibits no tenderness.  Abdominal: Soft. Bowel sounds are normal. He exhibits no distension and no mass. There is no tenderness. There is no rebound and no guarding.  Musculoskeletal: Normal range of motion. He exhibits no edema, tenderness or deformity.  Lymphadenopathy:    He has no cervical adenopathy.  Neurological: He is oriented to person, place, and time.  Skin: Skin is warm and dry. No rash noted. He is not diaphoretic. No erythema. No pallor.  Vitals reviewed.   Lab Results  Component Value Date   WBC 7.4 07/09/2016   HGB 14.4 07/09/2016   HCT 43.3 07/09/2016   PLT 300.0 07/09/2016   GLUCOSE 119 (H) 11/12/2016   CHOL 201 (H) 11/12/2016   TRIG 123.0 11/12/2016   HDL 32.50 (L) 11/12/2016   LDLDIRECT 134.0 07/09/2016   LDLCALC 144 (H) 11/12/2016   ALT 53 11/12/2016   AST 31 11/12/2016   NA 138 11/12/2016   K 4.3 11/12/2016   CL 103 11/12/2016   CREATININE 0.90 11/12/2016   BUN 14 11/12/2016   CO2 29 11/12/2016   TSH 1.12 07/09/2016   PSA 0.91 10/13/2013   HGBA1C 6.8 (H) 11/12/2016   MICROALBUR <0.7 09/10/2015    Dg Knee Complete 4 Views Right  Result Date: 09/04/2016 CLINICAL DATA:  Right knee pain for 7 days, no known injury EXAM: RIGHT KNEE - COMPLETE 4+ VIEW COMPARISON:  None. FINDINGS: Four views of the right knee submitted. No acute fracture or subluxation. Joint space is preserved. Small joint effusion. Mild spurring of patella. IMPRESSION: No acute fracture or subluxation. Small joint effusion. Mild spurring of patella. Electronically Signed   By: Lahoma Crocker M.D.   On: 09/04/2016 11:33    Assessment & Plan:   Marcus Schultz was seen today for diabetes, hyperlipidemia and hypertension.  Diagnoses and all orders for this visit:  Diabetes  mellitus type 2 in nonobese [facility])- his A1c is at 6.8%. His blood sugars are adequately well controlled. -     telmisartan (MICARDIS) 40 MG tablet; Take 1 tablet (40 mg total) by mouth daily. -     Ambulatory referral to Ophthalmology -     Hemoglobin A1c; Future -     Comprehensive metabolic panel; Future -     Microalbumin / creatinine urine ratio; Future -     Pitavastatin Calcium (LIVALO) 2 MG TABS; Take 1 tablet (2 mg total) by mouth daily.  Hyperlipidemia with target LDL less than 130- he has not achieved his LDL goal, I've asked him to start taking a statin for cardiovascular risk reduction. -     Lipid panel; Future -     Pitavastatin Calcium (LIVALO) 2 MG TABS; Take 1 tablet (2 mg total) by mouth daily.  Pure  hyperglyceridemia- improvement noted with lifestyle modifications. -     Lipid panel; Future  Essential hypertension- his labs are negative for any end organ damage or secondary causes but his blood pressure is not adequately well controlled so Avastin to start taking an ARB. -     telmisartan (MICARDIS) 40 MG tablet; Take 1 tablet (40 mg total) by mouth daily. -     Urinalysis, Routine w reflex microscopic; Future  Otalgia of left ear- he has no alarm symptoms and examination is normal, I don't have an explanation for his discomfort but that will oblige his request to see an ENT physician. -     Ambulatory referral to ENT  Blurred vision, bilateral -     Ambulatory referral to Ophthalmology   I have discontinued Mr. Michl's KOMBIGLYZE XR. I am also having him start on telmisartan and Pitavastatin Calcium. Additionally, I am having him maintain his tadalafil, ranitidine, ONETOUCH VERIO FLEX SYSTEM, glucose blood, albuterol, PARoxetine, JANUMET XR, testosterone cypionate, and fluticasone.  Meds ordered this encounter  Medications  . telmisartan (MICARDIS) 40 MG tablet    Sig: Take 1 tablet (40 mg total) by mouth daily.    Dispense:  90 tablet    Refill:  1  .  Pitavastatin Calcium (LIVALO) 2 MG TABS    Sig: Take 1 tablet (2 mg total) by mouth daily.    Dispense:  90 tablet    Refill:  3     Follow-up: Return in about 4 months (around 03/12/2017).  Scarlette Calico, MD

## 2016-11-12 NOTE — Patient Instructions (Signed)
Hypertension Hypertension, commonly called high blood pressure, is when the force of blood pumping through your arteries is too strong. Your arteries are the blood vessels that carry blood from your heart throughout your body. A blood pressure reading consists of a higher number over a lower number, such as 110/72. The higher number (systolic) is the pressure inside your arteries when your heart pumps. The lower number (diastolic) is the pressure inside your arteries when your heart relaxes. Ideally you want your blood pressure below 120/80. Hypertension forces your heart to work harder to pump blood. Your arteries may become narrow or stiff. Having untreated or uncontrolled hypertension can cause heart attack, stroke, kidney disease, and other problems. What increases the risk? Some risk factors for high blood pressure are controllable. Others are not. Risk factors you cannot control include:  Race. You may be at higher risk if you are African American.  Age. Risk increases with age.  Gender. Men are at higher risk than women before age 45 years. After age 65, women are at higher risk than men. Risk factors you can control include:  Not getting enough exercise or physical activity.  Being overweight.  Getting too much fat, sugar, calories, or salt in your diet.  Drinking too much alcohol. What are the signs or symptoms? Hypertension does not usually cause signs or symptoms. Extremely high blood pressure (hypertensive crisis) may cause headache, anxiety, shortness of breath, and nosebleed. How is this diagnosed? To check if you have hypertension, your health care provider will measure your blood pressure while you are seated, with your arm held at the level of your heart. It should be measured at least twice using the same arm. Certain conditions can cause a difference in blood pressure between your right and left arms. A blood pressure reading that is higher than normal on one occasion does  not mean that you need treatment. If it is not clear whether you have high blood pressure, you may be asked to return on a different day to have your blood pressure checked again. Or, you may be asked to monitor your blood pressure at home for 1 or more weeks. How is this treated? Treating high blood pressure includes making lifestyle changes and possibly taking medicine. Living a healthy lifestyle can help lower high blood pressure. You may need to change some of your habits. Lifestyle changes may include:  Following the DASH diet. This diet is high in fruits, vegetables, and whole grains. It is low in salt, red meat, and added sugars.  Keep your sodium intake below 2,300 mg per day.  Getting at least 30-45 minutes of aerobic exercise at least 4 times per week.  Losing weight if necessary.  Not smoking.  Limiting alcoholic beverages.  Learning ways to reduce stress. Your health care provider may prescribe medicine if lifestyle changes are not enough to get your blood pressure under control, and if one of the following is true:  You are 18-59 years of age and your systolic blood pressure is above 140.  You are 60 years of age or older, and your systolic blood pressure is above 150.  Your diastolic blood pressure is above 90.  You have diabetes, and your systolic blood pressure is over 140 or your diastolic blood pressure is over 90.  You have kidney disease and your blood pressure is above 140/90.  You have heart disease and your blood pressure is above 140/90. Your personal target blood pressure may vary depending on your medical   conditions, your age, and other factors. Follow these instructions at home:  Have your blood pressure rechecked as directed by your health care provider.  Take medicines only as directed by your health care provider. Follow the directions carefully. Blood pressure medicines must be taken as prescribed. The medicine does not work as well when you skip  doses. Skipping doses also puts you at risk for problems.  Do not smoke.  Monitor your blood pressure at home as directed by your health care provider. Contact a health care provider if:  You think you are having a reaction to medicines taken.  You have recurrent headaches or feel dizzy.  You have swelling in your ankles.  You have trouble with your vision. Get help right away if:  You develop a severe headache or confusion.  You have unusual weakness, numbness, or feel faint.  You have severe chest or abdominal pain.  You vomit repeatedly.  You have trouble breathing. This information is not intended to replace advice given to you by your health care provider. Make sure you discuss any questions you have with your health care provider. Document Released: 09/22/2005 Document Revised: 02/28/2016 Document Reviewed: 07/15/2013 Elsevier Interactive Patient Education  2017 [employer]  

## 2016-11-16 ENCOUNTER — Encounter: Payer: Self-pay | Admitting: Internal Medicine

## 2016-11-17 ENCOUNTER — Other Ambulatory Visit: Payer: Self-pay | Admitting: Internal Medicine

## 2016-11-17 DIAGNOSIS — E119 Type 2 diabetes mellitus without complications: Secondary | ICD-10-CM

## 2016-11-17 DIAGNOSIS — E785 Hyperlipidemia, unspecified: Secondary | ICD-10-CM

## 2016-11-17 MED ORDER — PITAVASTATIN CALCIUM 2 MG PO TABS: 1.0000 | ORAL_TABLET | Freq: Every day | ORAL | 3 refills | Status: DC

## 2016-11-18 ENCOUNTER — Other Ambulatory Visit: Payer: Self-pay | Admitting: *Deleted

## 2016-11-18 ENCOUNTER — Other Ambulatory Visit: Payer: Self-pay | Admitting: Internal Medicine

## 2016-11-18 DIAGNOSIS — E119 Type 2 diabetes mellitus without complications: Secondary | ICD-10-CM

## 2016-11-18 DIAGNOSIS — E785 Hyperlipidemia, unspecified: Secondary | ICD-10-CM

## 2016-11-18 MED ORDER — ATORVASTATIN CALCIUM 40 MG PO TABS: 40.0000 mg | ORAL_TABLET | Freq: Every day | ORAL | 3 refills | Status: AC

## 2016-11-18 MED ORDER — SITAGLIP PHOS-METFORMIN HCL ER 50-1000 MG PO TB24: 1.0000 | ORAL_TABLET | Freq: Every day | ORAL | 1 refills | Status: DC

## 2016-11-22 LAB — HM DIABETES EYE EXAM

## 2016-12-20 ENCOUNTER — Other Ambulatory Visit: Payer: Self-pay | Admitting: Internal Medicine

## 2016-12-20 DIAGNOSIS — E119 Type 2 diabetes mellitus without complications: Secondary | ICD-10-CM

## 2017-01-13 ENCOUNTER — Telehealth: Payer: Self-pay | Admitting: Internal Medicine

## 2017-01-13 NOTE — Telephone Encounter (Signed)
Pt called to see if he could get a coupon for SitaGLIPtin-MetFORMIN HCl (JANUMET XR) 50-1000 MG TB24. He said that he used one last time he had this filled. Please advise.

## 2017-01-19 NOTE — Telephone Encounter (Signed)
Pt is calling again about this coupon

## 2017-01-27 ENCOUNTER — Other Ambulatory Visit: Payer: Self-pay | Admitting: Internal Medicine

## 2017-01-27 ENCOUNTER — Other Ambulatory Visit: Payer: Self-pay | Admitting: Nurse Practitioner

## 2017-01-27 DIAGNOSIS — J069 Acute upper respiratory infection, unspecified: Secondary | ICD-10-CM

## 2017-01-27 MED ORDER — ALBUTEROL SULFATE HFA 108 (90 BASE) MCG/ACT IN AERS: 1.0000 | INHALATION_SPRAY | Freq: Four times a day (QID) | RESPIRATORY_TRACT | 1 refills | Status: DC | PRN

## 2017-01-27 NOTE — Telephone Encounter (Signed)
Routing to [place], please advise, thanks 

## 2017-01-27 NOTE — Telephone Encounter (Signed)
Routing to dr Ronnald Ramp, are you ok with refilling med or do you need office visit, please advise, thanks

## 2017-03-23 ENCOUNTER — Encounter: Payer: Self-pay | Admitting: Internal Medicine

## 2017-03-23 ENCOUNTER — Other Ambulatory Visit (INDEPENDENT_AMBULATORY_CARE_PROVIDER_SITE_OTHER): Payer: BLUE CROSS/BLUE SHIELD

## 2017-03-23 ENCOUNTER — Ambulatory Visit (INDEPENDENT_AMBULATORY_CARE_PROVIDER_SITE_OTHER): Payer: BLUE CROSS/BLUE SHIELD | Admitting: Internal Medicine

## 2017-03-23 VITALS — BP 120/76 | HR 86 | Temp 99.1°F | Resp 16 | Ht 70.0 in | Wt 210.0 lb

## 2017-03-23 DIAGNOSIS — I10 Essential (primary) hypertension: Secondary | ICD-10-CM

## 2017-03-23 DIAGNOSIS — J0100 Acute maxillary sinusitis, unspecified: Secondary | ICD-10-CM

## 2017-03-23 DIAGNOSIS — E119 Type 2 diabetes mellitus without complications: Secondary | ICD-10-CM

## 2017-03-23 LAB — CBC WITH DIFFERENTIAL/PLATELET
BASOS ABS: 0.1 10*3/uL (ref 0.0–0.1)
Basophils Relative: 0.5 % (ref 0.0–3.0)
EOS PCT: 4.3 % (ref 0.0–5.0)
Eosinophils Absolute: 0.4 10*3/uL (ref 0.0–0.7)
HCT: 46.5 % (ref 39.0–52.0)
HEMOGLOBIN: 15.6 g/dL (ref 13.0–17.0)
LYMPHS ABS: 3.7 10*3/uL (ref 0.7–4.0)
Lymphocytes Relative: 36.6 % (ref 12.0–46.0)
MCHC: 33.5 g/dL (ref 30.0–36.0)
MCV: 76.2 fl — ABNORMAL LOW (ref 78.0–100.0)
MONO ABS: 0.8 10*3/uL (ref 0.1–1.0)
MONOS PCT: 8.4 % (ref 3.0–12.0)
NEUTROS PCT: 50.2 % (ref 43.0–77.0)
Neutro Abs: 5.1 10*3/uL (ref 1.4–7.7)
Platelets: 284 10*3/uL (ref 150.0–400.0)
RBC: 6.11 Mil/uL — AB (ref 4.22–5.81)
RDW: 13.6 % (ref 11.5–15.5)
WBC: 10.1 10*3/uL (ref 4.0–10.5)

## 2017-03-23 LAB — COMPREHENSIVE METABOLIC PANEL
ALBUMIN: 4.1 g/dL (ref 3.5–5.2)
ALK PHOS: 70 U/L (ref 39–117)
ALT: 35 U/L (ref 0–53)
AST: 17 U/L (ref 0–37)
BILIRUBIN TOTAL: 0.3 mg/dL (ref 0.2–1.2)
BUN: 14 mg/dL (ref 6–23)
CO2: 26 mEq/L (ref 19–32)
CREATININE: 0.79 mg/dL (ref 0.40–1.50)
Calcium: 8.9 mg/dL (ref 8.4–10.5)
Chloride: 101 mEq/L (ref 96–112)
GFR: 115.18 mL/min (ref >60.00)
GLUCOSE: 150 mg/dL — AB (ref 70–99)
POTASSIUM: 3.9 meq/L (ref 3.5–5.1)
SODIUM: 135 meq/L (ref 135–145)
TOTAL PROTEIN: 6.7 g/dL (ref 6.0–8.3)

## 2017-03-23 LAB — URINALYSIS, ROUTINE W REFLEX MICROSCOPIC
BILIRUBIN URINE: NEGATIVE
Hgb urine dipstick: NEGATIVE
Ketones, ur: NEGATIVE
Leukocytes, UA: NEGATIVE
Nitrite: NEGATIVE
PH: 6 (ref 5.0–8.0)
RBC / HPF: NONE SEEN (ref 0–?)
SPECIFIC GRAVITY, URINE: 1.02 (ref 1.000–1.030)
TOTAL PROTEIN, URINE-UPE24: NEGATIVE
Urine Glucose: NEGATIVE
Urobilinogen, UA: 0.2 (ref 0.0–1.0)
WBC, UA: NONE SEEN (ref 0–?)

## 2017-03-23 LAB — HEMOGLOBIN A1C: HEMOGLOBIN A1C: 7.5 % — AB (ref 4.6–6.5)

## 2017-03-23 LAB — MICROALBUMIN / CREATININE URINE RATIO
CREATININE, U: 132.7 mg/dL
MICROALB/CREAT RATIO: 0.5 mg/g (ref 0.0–30.0)
Microalb, Ur: <0.7 mg/dL (ref 0.0–1.9)

## 2017-03-23 MED ORDER — CEFDINIR 300 MG PO CAPS: 300.0000 mg | ORAL_CAPSULE | Freq: Two times a day (BID) | ORAL | 0 refills | Status: AC

## 2017-03-23 NOTE — Progress Notes (Signed)
Subjective:  Patient ID: Marcus Schultz, male    DOB: 1976/04/24  Age: 41 y.o. MRN: [id number]  CC: Hypertension; Diabetes; and Sinusitis   HPI Marcus Schultz presents for f/up - he complains of a 5 day history of sore throat, right-sided facial pain,  thick yellow nasal phlegm, a few episodes of low-grade fever and chills.  He denies cough, lymphadenopathy, or rash.  Outpatient Medications Prior to Visit  Medication Sig Dispense Refill  . albuterol (PROVENTIL HFA;VENTOLIN HFA) 108 (90 Base) MCG/ACT inhaler Inhale 1-2 puffs into the lungs every 6 (six) hours as needed for wheezing or shortness of breath. 1 Inhaler 1  . atorvastatin (LIPITOR) 40 MG tablet Take 1 tablet (40 mg total) by mouth daily. 90 tablet 3  . Blood Glucose Monitoring Suppl (ONETOUCH VERIO FLEX SYSTEM) w/Device KIT 1 Act by Does not apply route 3 (three) times daily with meals. 2 kit 0  . fluticasone (FLONASE) 50 MCG/ACT nasal spray SHAKE LIQUID AND USE 2 SPRAYS IN EACH NOSTRIL DAILY 16 g 11  . glucose blood test strip 1 each by Other route 2 (two) times daily. Use to check blood sugars twice a day Dx E11.9 100 each 12  . PARoxetine (PAXIL) 10 MG tablet Take 1 tablet (10 mg total) by mouth daily. 90 tablet 3  . ranitidine (ZANTAC) 150 MG tablet Take 1 tablet (150 mg total) by mouth 2 (two) times daily. 60 tablet 0  . SitaGLIPtin-MetFORMIN HCl (JANUMET XR) 50-1000 MG TB24 Take 1 tablet by mouth daily. 90 tablet 1  . tadalafil (CIALIS) 5 MG tablet Take 1 tablet (5 mg total) by mouth daily as needed for erectile dysfunction. 10 tablet 11  . telmisartan (MICARDIS) 40 MG tablet Take 1 tablet (40 mg total) by mouth daily. 90 tablet 1  . testosterone cypionate (DEPOTESTOSTERONE CYPIONATE) 200 MG/ML injection INJECT 1 ML IN THE MUSCLE EVERY 14 DAYS 10 mL 1   No facility-administered medications prior to visit.     ROS Review of Systems  Constitutional: Negative.  Negative for chills, fatigue and fever.  HENT: Positive for  postnasal drip, rhinorrhea, sinus pain, sinus pressure and sore throat. Negative for congestion, facial swelling, trouble swallowing and voice change.   Eyes: Negative for visual disturbance.  Respiratory: Negative for cough, chest tightness, shortness of breath and wheezing.   Cardiovascular: Negative for chest pain, palpitations and leg swelling.  Gastrointestinal: Negative for abdominal pain, constipation, diarrhea, nausea and vomiting.  Endocrine: Negative.  Negative for polydipsia, polyphagia and polyuria.  Genitourinary: Negative.  Negative for difficulty urinating, dysuria, frequency, hematuria and urgency.  Musculoskeletal: Negative.  Negative for back pain, myalgias and neck pain.  Skin: Negative.  Negative for color change and rash.  Neurological: Negative.  Negative for dizziness, weakness and headaches.  Hematological: Negative for adenopathy. Does not bruise/bleed easily.  Psychiatric/Behavioral: Negative.     Objective:  BP 120/76 (BP Location: Left Arm, Patient Position: Sitting, Cuff Size: Normal)   Pulse 86   Temp 99.1 F (37.3 C) (Oral)   Resp 16   Ht '5\' 10"'$  (1.778 m)   Wt 210 lb (95.3 kg)   SpO2 96%   BMI 30.13 kg/m   BP Readings from Last 3 Encounters:  03/23/17 120/76  11/12/16 (!) 130/96  09/04/16 (!) 142/86    Wt Readings from Last 3 Encounters:  03/23/17 210 lb (95.3 kg)  11/12/16 220 lb 8 oz (100 kg)  09/04/16 223 lb (101.2 kg)    Physical Exam  Constitutional:  He is oriented to person, place, and time. No distress.  HENT:  Right Ear: Hearing, tympanic membrane, external ear and ear canal normal.  Left Ear: Hearing, tympanic membrane, external ear and ear canal normal.  Nose: No mucosal edema, rhinorrhea or sinus tenderness. No epistaxis. Right sinus exhibits maxillary sinus tenderness. Right sinus exhibits no frontal sinus tenderness. Left sinus exhibits no maxillary sinus tenderness and no frontal sinus tenderness.  Mouth/Throat: Oropharynx is  clear and moist. Mucous membranes are not pale, not dry and not cyanotic. No oral lesions. No trismus in the jaw. No uvula swelling. No oropharyngeal exudate, posterior oropharyngeal edema, posterior oropharyngeal erythema or tonsillar abscesses.  Eyes: Conjunctivae are normal. Right eye exhibits no discharge. Left eye exhibits no discharge. No scleral icterus.  Neck: Normal range of motion. Neck supple. No JVD present. No thyromegaly present.  Cardiovascular: Normal rate, regular rhythm and intact distal pulses.  Exam reveals no gallop.   No murmur heard. Pulmonary/Chest: Effort normal and breath sounds normal. No respiratory distress. He has no wheezes. He has no rales. He exhibits no tenderness.  Abdominal: Soft. Bowel sounds are normal. He exhibits no distension and no mass. There is no tenderness. There is no rebound and no guarding.  Musculoskeletal: Normal range of motion. He exhibits no edema, tenderness or deformity.  Lymphadenopathy:    He has no cervical adenopathy.  Neurological: He is alert and oriented to person, place, and time.  Skin: Skin is warm and dry. No rash noted. He is not diaphoretic. No erythema. No pallor.    Lab Results  Component Value Date   WBC 10.1 03/23/2017   HGB 15.6 03/23/2017   HCT 46.5 03/23/2017   PLT 284.0 03/23/2017   GLUCOSE 150 (H) 03/23/2017   CHOL 201 (H) 11/12/2016   TRIG 123.0 11/12/2016   HDL 32.50 (L) 11/12/2016   LDLDIRECT 134.0 07/09/2016   LDLCALC 144 (H) 11/12/2016   ALT 35 03/23/2017   AST 17 03/23/2017   NA 135 03/23/2017   K 3.9 03/23/2017   CL 101 03/23/2017   CREATININE 0.79 03/23/2017   BUN 14 03/23/2017   CO2 26 03/23/2017   TSH 1.12 07/09/2016   PSA 0.91 10/13/2013   HGBA1C 7.5 (H) 03/23/2017   MICROALBUR <0.7 03/23/2017    Dg Knee Complete 4 Views Right  Result Date: 09/04/2016 CLINICAL DATA:  Right knee pain for 7 days, no known injury EXAM: RIGHT KNEE - COMPLETE 4+ VIEW COMPARISON:  None. FINDINGS: Four views  of the right knee submitted. No acute fracture or subluxation. Joint space is preserved. Small joint effusion. Mild spurring of patella. IMPRESSION: No acute fracture or subluxation. Small joint effusion. Mild spurring of patella. Electronically Signed   By: Lahoma Crocker M.D.   On: 09/04/2016 11:33    Assessment & Plan:   Saverio was seen today for hypertension, diabetes and sinusitis.  Diagnoses and all orders for this visit:  Diabetes mellitus type 2 in nonobese [facility])- His A1c is up to 7.5%, he will continue the current medications and I have informed him that he needs to improve his lifestyle modifications. -     Comprehensive metabolic panel; Future -     Hemoglobin A1c; Future -     Microalbumin / creatinine urine ratio; Future  Essential hypertension- his blood pressure is well-controlled, electrolytes and renal function are normal. -     CBC with Differential/Platelet; Future -     Comprehensive metabolic panel; Future -     Urinalysis,  Routine w reflex microscopic; Future  Acute non-recurrent maxillary sinusitis -     cefdinir (OMNICEF) 300 MG capsule; Take 1 capsule (300 mg total) by mouth 2 (two) times daily.   I am having Mr. Smyre start on cefdinir. I am also having him maintain his tadalafil, ranitidine, ONETOUCH VERIO FLEX SYSTEM, glucose blood, PARoxetine, testosterone cypionate, fluticasone, telmisartan, SitaGLIPtin-MetFORMIN HCl, atorvastatin, and albuterol.  Meds ordered this encounter  Medications  . cefdinir (OMNICEF) 300 MG capsule    Sig: Take 1 capsule (300 mg total) by mouth 2 (two) times daily.    Dispense:  20 capsule    Refill:  0     Follow-up: Return in about 3 weeks (around 04/13/2017).  Scarlette Calico, MD

## 2017-03-23 NOTE — Patient Instructions (Signed)

## 2017-04-08 ENCOUNTER — Other Ambulatory Visit: Payer: Self-pay | Admitting: Internal Medicine

## 2017-04-08 DIAGNOSIS — R7989 Other specified abnormal findings of blood chemistry: Secondary | ICD-10-CM

## 2017-06-01 ENCOUNTER — Other Ambulatory Visit: Payer: Self-pay | Admitting: Internal Medicine

## 2017-06-01 DIAGNOSIS — E119 Type 2 diabetes mellitus without complications: Secondary | ICD-10-CM

## 2017-06-01 DIAGNOSIS — I10 Essential (primary) hypertension: Secondary | ICD-10-CM

## 2017-07-27 ENCOUNTER — Ambulatory Visit: Payer: BLUE CROSS/BLUE SHIELD | Admitting: Nurse Practitioner

## 2017-08-17 ENCOUNTER — Other Ambulatory Visit: Payer: Self-pay | Admitting: Internal Medicine

## 2017-08-19 ENCOUNTER — Telehealth: Payer: Self-pay | Admitting: Internal Medicine

## 2017-08-19 DIAGNOSIS — E119 Type 2 diabetes mellitus without complications: Secondary | ICD-10-CM

## 2017-08-19 DIAGNOSIS — I10 Essential (primary) hypertension: Secondary | ICD-10-CM

## 2017-08-19 MED ORDER — TELMISARTAN 40 MG PO TABS: ORAL_TABLET | ORAL | 0 refills | Status: AC

## 2017-08-19 NOTE — Telephone Encounter (Signed)
Pt needs a refill on telmisartan (MICARDIS) 40 MG tablet sent to [facility] on [facility].

## 2017-08-19 NOTE — Telephone Encounter (Signed)
Reviewed chart pt was supposed to return back in July sent 30 day script until appt w/MD.../lmb

## 2017-10-01 ENCOUNTER — Encounter: Payer: Self-pay | Admitting: Nurse Practitioner

## 2017-10-01 ENCOUNTER — Ambulatory Visit: Payer: BLUE CROSS/BLUE SHIELD | Admitting: Nurse Practitioner

## 2017-10-01 VITALS — BP 116/86 | HR 85 | Temp 97.6°F | Ht 70.0 in | Wt 206.7 lb

## 2017-10-01 DIAGNOSIS — J069 Acute upper respiratory infection, unspecified: Secondary | ICD-10-CM

## 2017-10-01 LAB — POC INFLUENZA A&B (BINAX/QUICKVUE)
INFLUENZA A, POC: NEGATIVE
Influenza B, POC: NEGATIVE

## 2017-10-01 MED ORDER — HYDROCODONE-HOMATROPINE 5-1.5 MG/5ML PO SYRP: 5.0000 mL | ORAL_SOLUTION | Freq: Four times a day (QID) | ORAL | 0 refills | Status: AC | PRN

## 2017-10-01 MED ORDER — AZITHROMYCIN 250 MG PO TABS: 250.0000 mg | ORAL_TABLET | Freq: Every day | ORAL | 0 refills | Status: AC

## 2017-10-01 MED ORDER — DM-GUAIFENESIN ER 30-600 MG PO TB12: 1.0000 | ORAL_TABLET | Freq: Two times a day (BID) | ORAL | 0 refills | Status: AC | PRN

## 2017-10-01 MED ORDER — ALBUTEROL SULFATE HFA 108 (90 BASE) MCG/ACT IN AERS: 1.0000 | INHALATION_SPRAY | Freq: Four times a day (QID) | RESPIRATORY_TRACT | 1 refills | Status: AC | PRN

## 2017-10-01 NOTE — Patient Instructions (Addendum)
URI Instructions: Flonase and Afrin use: apply 1spray of afrin in each nare, wait 26mins, then apply 2sprays of flonase in each nare. Use both nasal spray consecutively x 3days, then flonase only for at least 14days.  Encourage adequate oral hydration.  Use over-the-counter  "cold" medicines  such as "Tylenol cold" , "Advil cold",  "Mucinex" or" Mucinex D"  for cough and congestion.  Avoid decongestants if you have high blood pressure. Use" Delsym" or" Robitussin" cough syrup varietis for cough.  You can use plain "Tylenol" or "Advi"l for fever, chills and achyness.   "Common cold" symptoms are usually triggered by a virus.  The antibiotics are usually not necessary. On average, a" viral cold" illness would take 4-7 days to resolve. Please, make an appointment if you are not better or if you're worse.  Influenza swab is negative.

## 2017-10-01 NOTE — Progress Notes (Signed)
Subjective:  Patient ID: Marcus Schultz, male    DOB: Oct 31, 1975  Age: 41 y.o. MRN: [id number]  CC: Cough (Patient is here today C/O a cough x2d. It is productive with yellow sputum.  This am he states that he had a fever and took some tylenol.  Cough causes discomfort in chest.)   URI   This is a new problem. The current episode started in the past 7 days. The problem has been unchanged. Associated symptoms include chest pain, congestion, coughing, headaches, a plugged ear sensation, rhinorrhea, sinus pain, a sore throat and swollen glands. He has tried acetaminophen for the symptoms. The treatment provided mild relief.    Outpatient Medications Prior to Visit  Medication Sig Dispense Refill  . atorvastatin (LIPITOR) 40 MG tablet Take 1 tablet (40 mg total) by mouth daily. 90 tablet 3  . Blood Glucose Monitoring Suppl (ONETOUCH VERIO FLEX SYSTEM) w/Device KIT 1 Act by Does not apply route 3 (three) times daily with meals. 2 kit 0  . fluticasone (FLONASE) 50 MCG/ACT nasal spray SHAKE LIQUID AND USE 2 SPRAYS IN EACH NOSTRIL DAILY 16 g 11  . glucose blood test strip 1 each by Other route 2 (two) times daily. Use to check blood sugars twice a day Dx E11.9 100 each 12  . JANUMET XR 50-1000 MG TB24 TAKE 1 TABLET BY MOUTH DAILY 90 tablet 0  . PARoxetine (PAXIL) 10 MG tablet Take 1 tablet (10 mg total) by mouth daily. 90 tablet 3  . ranitidine (ZANTAC) 150 MG tablet Take 1 tablet (150 mg total) by mouth 2 (two) times daily. 60 tablet 0  . tadalafil (CIALIS) 5 MG tablet Take 1 tablet (5 mg total) by mouth daily as needed for erectile dysfunction. 10 tablet 11  . telmisartan (MICARDIS) 40 MG tablet TAKE 1 TABLET(40 MG) BY MOUTH DAILY. FOLLOW-UP APPT IS DUE 30 tablet 0  . testosterone cypionate (DEPOTESTOSTERONE CYPIONATE) 200 MG/ML injection INJECT 1 ML IN THE MUSCLE EVERY 14 DAYS 10 mL 0  . albuterol (PROVENTIL HFA;VENTOLIN HFA) 108 (90 Base) MCG/ACT inhaler Inhale 1-2 puffs into the lungs every 6  (six) hours as needed for wheezing or shortness of breath. 1 Inhaler 1   No facility-administered medications prior to visit.     ROS See HPI  Objective:  BP 116/86 (BP Location: Left Arm, Patient Position: Sitting, Cuff Size: Normal)   Pulse 85   Temp 97.6 F (36.4 C) (Oral)   Ht _0  (1.778 m)   Wt 206 lb 11.2 oz (93.8 kg)   SpO2 96%   BMI 29.66 kg/m   BP Readings from Last 3 Encounters:  10/01/17 116/86  03/23/17 120/76  11/12/16 (!) 130/96    Wt Readings from Last 3 Encounters:  10/01/17 206 lb 11.2 oz (93.8 kg)  03/23/17 210 lb (95.3 kg)  11/12/16 220 lb 8 oz (100 kg)    Physical Exam  Constitutional: He is oriented to person, place, and time. No distress.  HENT:  Right Ear: Tympanic membrane, external ear and ear canal normal.  Left Ear: Tympanic membrane and ear canal normal.  Nose: Mucosal edema and rhinorrhea present. Right sinus exhibits maxillary sinus tenderness and frontal sinus tenderness. Left sinus exhibits maxillary sinus tenderness and frontal sinus tenderness.  Mouth/Throat: Uvula is midline. Posterior oropharyngeal erythema present. No oropharyngeal exudate.  Eyes: No scleral icterus.  Neck: Normal range of motion. Neck supple.  Cardiovascular: Normal rate and regular rhythm.  Pulmonary/Chest: Effort normal and breath sounds normal.  Lymphadenopathy:    He has no cervical adenopathy.  Neurological: He is alert and oriented to person, place, and time.  Vitals reviewed.   Lab Results  Component Value Date   WBC 10.1 03/23/2017   HGB 15.6 03/23/2017   HCT 46.5 03/23/2017   PLT 284.0 03/23/2017   GLUCOSE 150 (H) 03/23/2017   CHOL 201 (H) 11/12/2016   TRIG 123.0 11/12/2016   HDL 32.50 (L) 11/12/2016   LDLDIRECT 134.0 07/09/2016   LDLCALC 144 (H) 11/12/2016   ALT 35 03/23/2017   AST 17 03/23/2017   NA 135 03/23/2017   K 3.9 03/23/2017   CL 101 03/23/2017   CREATININE 0.79 03/23/2017   BUN 14 03/23/2017   CO2 26 03/23/2017   TSH 1.12  07/09/2016   PSA 0.91 10/13/2013   HGBA1C 7.5 (H) 03/23/2017   MICROALBUR <0.7 03/23/2017    Dg Knee Complete 4 Views Right  Result Date: 09/04/2016 CLINICAL DATA:  Right knee pain for 7 days, no known injury EXAM: RIGHT KNEE - COMPLETE 4+ VIEW COMPARISON:  None. FINDINGS: Four views of the right knee submitted. No acute fracture or subluxation. Joint space is preserved. Small joint effusion. Mild spurring of patella. IMPRESSION: No acute fracture or subluxation. Small joint effusion. Mild spurring of patella. Electronically Signed   By: Lahoma Crocker M.D.   On: 09/04/2016 11:33    Assessment & Plan:   Marcus Schultz was seen today for cough.  Diagnoses and all orders for this visit:  Acute URI -     POC Influenza A&B(BINAX/QUICKVUE) -     HYDROcodone-homatropine (HYCODAN) 5-1.5 MG/5ML syrup; Take 5 mLs by mouth every 6 (six) hours as needed for cough. -     dextromethorphan-guaiFENesin (MUCINEX DM) 30-600 MG 12hr tablet; Take 1 tablet by mouth 2 (two) times daily as needed for cough. -     azithromycin (ZITHROMAX Z-PAK) 250 MG tablet; Take 1 tablet (250 mg total) by mouth daily. Take 2tabs on first day, then 1tab once a day till complete -     albuterol (PROVENTIL HFA;VENTOLIN HFA) 108 (90 Base) MCG/ACT inhaler; Inhale 1-2 puffs into the lungs every 6 (six) hours as needed for wheezing or shortness of breath.  Other orders -     Cancel: atorvastatin (LIPITOR) 40 MG tablet; Take 1 tablet (40 mg total) by mouth daily.   I am having Marcus Schultz start on HYDROcodone-homatropine, dextromethorphan-guaiFENesin, and azithromycin. I am also having him maintain his tadalafil, ranitidine, ONETOUCH VERIO FLEX SYSTEM, glucose blood, PARoxetine, fluticasone, atorvastatin, testosterone cypionate, JANUMET XR, telmisartan, and albuterol.  Meds ordered this encounter  Medications  . HYDROcodone-homatropine (HYCODAN) 5-1.5 MG/5ML syrup    Sig: Take 5 mLs by mouth every 6 (six) hours as needed for cough.     Dispense:  60 mL    Refill:  0    Order Specific Question:   Supervising Provider    Answer:   Lucille Passy [[id number]]  . dextromethorphan-guaiFENesin (MUCINEX DM) 30-600 MG 12hr tablet    Sig: Take 1 tablet by mouth 2 (two) times daily as needed for cough.    Dispense:  14 tablet    Refill:  0    Order Specific Question:   Supervising Provider    Answer:   Lucille Passy [[id number]]  . azithromycin (ZITHROMAX Z-PAK) 250 MG tablet    Sig: Take 1 tablet (250 mg total) by mouth daily. Take 2tabs on first day, then 1tab once a day till complete  Dispense:  6 tablet    Refill:  0    Order Specific Question:   Supervising Provider    Answer:   Lucille Passy [[id number]]  . albuterol (PROVENTIL HFA;VENTOLIN HFA) 108 (90 Base) MCG/ACT inhaler    Sig: Inhale 1-2 puffs into the lungs every 6 (six) hours as needed for wheezing or shortness of breath.    Dispense:  1 Inhaler    Refill:  1    Order Specific Question:   Supervising Provider    Answer:   Lucille Passy [[id number]]    Follow-up: No Follow-up on file.  Wilfred Lacy, NP

## 2017-10-22 ENCOUNTER — Telehealth: Payer: Self-pay | Admitting: Internal Medicine

## 2017-10-22 NOTE — Telephone Encounter (Signed)
Copied from [place] [phone]. Topic: Quick Communication - Rx Refill/Question >> Oct 22, 2017  1:59 PM Arletha Grippe wrote: Medication: testosterone cypionate (DEPOTESTOSTERONE CYPIONATE) 200 MG/ML injection    Has the patient contacted their pharmacy? Yes.  Told he had to call office    (Agent: If no, request that the patient contact the pharmacy for the refill.)   Preferred Pharmacy (with phone number or street name): [facility] [address]    Agent: Please be advised that RX refills may take up to 3 business days. We ask that you follow-up with your pharmacy.

## 2017-10-22 NOTE — Telephone Encounter (Signed)
Sent back to provider

## 2017-10-22 NOTE — Telephone Encounter (Signed)
Check [place] registry last filled 08/30/2017../lmb  

## 2017-10-23 ENCOUNTER — Other Ambulatory Visit: Payer: Self-pay | Admitting: Internal Medicine

## 2017-10-23 DIAGNOSIS — R7989 Other specified abnormal findings of blood chemistry: Secondary | ICD-10-CM

## 2017-10-23 NOTE — Telephone Encounter (Signed)
He has not been seen in 6 months He is due for follow-up on testosterone replacement therapy as well as blood sugar control. Please contact the [place] and asked him not to request for refills if the patient has not been seen in the last 6 months for narcotic or for a blood sugar medication.

## 2017-10-23 NOTE — Telephone Encounter (Signed)
Patient scheduled follow up appointment with Dr. Ronnald Ramp for 11/02/2017.

## 2017-11-02 ENCOUNTER — Ambulatory Visit: Payer: BLUE CROSS/BLUE SHIELD | Admitting: Internal Medicine

## 2017-12-30 ENCOUNTER — Other Ambulatory Visit: Payer: Self-pay | Admitting: Internal Medicine

## 2017-12-30 DIAGNOSIS — E119 Type 2 diabetes mellitus without complications: Secondary | ICD-10-CM

## 2017-12-30 DIAGNOSIS — J069 Acute upper respiratory infection, unspecified: Secondary | ICD-10-CM

## 2017-12-30 DIAGNOSIS — E785 Hyperlipidemia, unspecified: Secondary | ICD-10-CM

## 2018-08-17 NOTE — ED Notes (Signed)
 ED Patient Education Note     Patient Education Materials Follows:  Ophthalmology     Chemical Conjunctivitis    Chemical conjunctivitis is eye inflammation from exposure to an irritant or chemical substance. This causes the clear membrane that covers the white part of your eye and the inner surface of your eyelid (conjunctiva) to become inflamed. When the blood vessels in the conjunctiva become inflamed, the eye may become red, pink, and itchy.      Chemical conjunctivitis can occur in one or both eyes. It cannot be spread by one person to another person (noncontagious).    CAUSES    This condition is caused by exposure to a chemical substance or irritant, such as:     Smoke.     Chlorine.     Soap.     Fumes.     Air pollution.    RISK FACTORS    This condition is more likely to develop in:     People who live somewhere with high levels of air pollution.     People who use swimming pools often.    SYMPTOMS    Symptoms of this condition may include:     Eye redness.     Tearing of the eyes.     Watery eyes.     Itchy eyes.     Burning feeling in the eyes.     Clear drainage from the eyes.     Swollen eyelids.     Sensitivity to light.    DIAGNOSIS    Your health care provider can diagnose this condition from your symptoms and medical history. The health care provider will also do a physical exam. If you have drainage from your eyes, it may be tested to rule out other causes of conjunctivitis. Your health care provider may also use a medical instrument that uses magnified light to examine the eyes (slit lamp).     TREATMENT    Treatment for this condition involves carefully flushing the chemical out of your eye. You may also get antiallergy medicines or eye drops to use at home.    HOME CARE INSTRUCTIONS     Take or apply medicines only as directed by your health care provider.     Do not touch or rub your eyes.     Do not wear contact lenses until the inflammation is gone. Wear glasses instead.     Do not wear eye  makeup until the inflammation is gone.     Apply a cool, clean washcloth to your eye for 10?20 minutes, 3?4 times a day.     Avoid exposure to the chemical or environment that caused the irritation. Wear eye protection as necessary.    SEEK MEDICAL CARE IF:     Your symptoms get worse.     You have pus draining from your eye.     You have new symptoms.     You have a fever.     You have a change in vision.     You have increasing pain.    This information is not intended to replace advice given to you by your health care provider. Make sure you discuss any questions you have with your health care provider.    Document Released: 07/02/2005 Document Revised: 10/13/2014 Document Reviewed: 07/04/2014  Elsevier Interactive Patient Education ?2016 [employer] Inc[employer]                 Return to Work  __________________________________________________was treated  at our facility.  RETURN TO WORK  . Employee may return to work on ____________________.      Show this Return to Work statement to your supervisor at work as soon as possible. Your employer should be aware of your condition and can help with the necessary work activity restrictions. If you wish to return to work sooner than the date that is listed above, or if you have further problems that make it difficult for you to return at that time, please call our clinic or your health care provider.     Justin A. Kay, MD  Health Care Provider Name (printed)  _________________________________________  Health Care Provider (signature)   _________________________________________  Date  This information is not intended to replace advice given to you by your health care provider. Make sure you discuss any questions you have with your health care provider.  Document Released: 09/22/2005 Document Revised: 10/13/2014 Document Reviewed: 04/21/2014  Elsevier Interactive Patient Education [employer]

## 2018-08-17 NOTE — ED Notes (Signed)
ED Triage Note       ED Triage Adult Entered On:  08/17/2018 20:40 EST    Performed On:  08/17/2018 20:37 EST by Patsi Sears, RN, BROOKE N               Triage   Chief Complaint :   Pt. c/o pain/irritation to L eye, states happened yesterday at work. denies vision changes.    Numeric Rating Pain Scale :   0 = No pain   [place] Mode of Arrival :   Private vehicle   Temperature Oral :   36.8 degC(Converted to: 98.2 degF)    Heart Rate Monitored :   82 bpm   Respiratory Rate :   17 br/min   Systolic Blood Pressure :   144 mmHg (HI)    Diastolic Blood Pressure :   99 mmHg (HI)    SpO2 :   97 %   Oxygen Therapy :   Room air   Patient presentation :   None of the above   Chief Complaint or Presentation suggest infection :   No   Dosing Weight Obtained By :   Patient stated   Weight Dosing :   91.8 kg(Converted to: 202 lb 6 oz)    Height :   177 cm(Converted to: 5 ft 10 in)    Body Mass Index Dosing :   29 kg/m2   ED General Section :   Document assessment   Pregnancy Status :   N/A   Patsi Sears, RN, BROOKE N - 08/17/2018 20:37 EST   DCP GENERIC CODE   Tracking Acuity :   4   Tracking Group :   ED [id number] Tracking Group   Frank, RN, Mertha Finders - 08/17/2018 20:37 EST   ED Allergies Section :   Document assessment   ED Reason for Visit Section :   Document assessment   ED Home Meds Section :   Document assessment   ED Quick Assessment :   Patient appears awake, alert, oriented to baseline. Skin warm and dry. Moves all extremities. Respiration even and unlabored. Appears in no apparent distress.   CANADY, RN, BROOKE N - 08/17/2018 20:37 EST   Allergies   (As Of: 08/17/2018 20:40:46 EST)   Allergies (Active)   No Known Medication Allergies  Estimated Onset Date:   Unspecified ; Created ByPatsi Sears, RN, BROOKE N; Reaction Status:   Active ; Category:   Drug ; Substance:   No Known Medication Allergies ; Type:   Allergy ; Updated By:   Annamarie Major; Reviewed Date:   08/17/2018 20:40 EST        Psycho-Social   Suicidal Ideation :    None   ED Homicide Ideations :   No   Feels Unsafe at Home :   No   ED Behavioral Activity Rating Scale :   4 - Quiet and awake (normal level of activity)   CANADY, RN, Mertha Finders - 08/17/2018 20:37 EST   ED Home Med List   Medication List   (As Of: 08/17/2018 20:40:46 EST)   No Known Home Medications     Patsi Sears RN, Mertha Finders 08/17/2018 20:40:44           ED Reason for Visit   (As Of: 08/17/2018 20:40:46 EST)   Problems(Active)    No Chronic Problems (Cerner  :NKP )  Name of Problem:   No Chronic Problems ; Recorder:   Patsi Sears, RN,  BROOKE N; Code:   NKP ; Last Updated:   08/17/2018 20:40 EST ; Life Cycle Date:   08/17/2018 ; Life Cycle Status:   Active ; Vocabulary:   Cerner          Diagnoses(Active)    Eye pain  Date:   08/17/2018 ; Diagnosis Type:   Reason For Visit ; Confirmation:   Complaint of ; Clinical Dx:   Eye pain ; Classification:   Medical ; Clinical Service:   Emergency medicine ; Code:   PNED ; Probability:   0 ; Diagnosis Code:   [id number]

## 2018-08-17 NOTE — Discharge Summary (Signed)
ED Clinical Summary                     [facility]  [address]  [place], [place] [id number]  [phone]          PERSON INFORMATION  Name: Jon CantorSSADI, Jon Age:  [age] Years DOB: 1976/05/22   Sex: Male Language: English PCP: Carlena BjornstadEDWINE-MD, SR, JOHN MARK   Marital Status: Single Phone: [phone] Med Service: Nickolas Madrid[facility] Nurses   MRN: [id number] Acct# [id number] Arrival: 08/17/2018 20:29:00   Visit Reason: Eye pain; IRRITATED EYE Acuity: 4 LOS: 000 01:39   Address:    [address] [place] [id number]   Diagnosis:    Chemical conjunctivitis  Medications:    Medications Administered During Visit:                Medication Dose Route   tetracaine ophthalmic 1 drops OPHTH   fluorescein ophthalmic 1 mg OPHTH               Allergies      No Known Medication Allergies      Major Tests and Procedures:  The following procedures and tests were performed during your ED visit.  COMMON PROCEDURES%>  COMMON PROCEDURES COMMENTS%>                PROVIDER INFORMATION               Provider Role Assigned Hart RochesterUnassigned   NORRIS-MD, JUSTIN AARON ED Provider 08/17/2018 20:43:33    Myer PeerJACOBIK, EMILY A ED Nurse 08/17/2018 20:45:35        Attending Physician:  Londell MohNORRIS-MD,  JUSTIN AARON      Admit Doc  NORRIS-MD,  Lorri FrederickJUSTIN AARON     Consulting Doc       VITALS INFORMATION  Vital Sign Triage Latest   Temp Oral ORAL_1%> ORAL%>   Temp Temporal TEMPORAL_1%> TEMPORAL%>   Temp Intravascular INTRAVASCULAR_1%> INTRAVASCULAR%>   Temp Axillary AXILLARY_1%> AXILLARY%>   Temp Rectal RECTAL_1%> RECTAL%>   02 Sat 97 % 97 %   Respiratory Rate RATE_1%> RATE%>   Peripheral Pulse Rate PULSE RATE_1%> PULSE RATE%>   Apical Heart Rate HEART RATE_1%> HEART RATE%>   Blood Pressure BLOOD PRESSURE_1%>/ BLOOD PRESSURE_1%>99 mmHg BLOOD PRESSURE%> / BLOOD PRESSURE%>99 mmHg                 Immunizations      No Immunizations Documented This Visit          DISCHARGE INFORMATION   Discharge Disposition: H Outpt-Sent Home   Discharge  Location:  Home   Discharge Date and Time:  08/17/2018 22:08:02   ED Checkout Date and Time:  08/17/2018 22:08:02     DEPART REASON INCOMPLETE INFORMATION               Depart Action Incomplete Reason   Interactive View/I&O Recently assessed               Problems      Active           No Chronic Problems              Smoking Status      No Smoking Status Documented         PATIENT EDUCATION INFORMATION  Instructions:     Form - Return To Work Ranell Patrick(Norris) [phone]([id number]); Chemical Conjunctivitis     Follow up:  With: Address: When:   Horatio Pel, [facility] [address] [place], [place] [address]  [phone] Business (1) In 3 days 08/20/2018   Comments:   Return to the ED immediately if symptoms worse in anyway or if any new symptoms develop. Arrange follow-up with occupational medical doctor for further evaluation of today's complaints. Take (4) 200 mg Motrins every 8 hours to help with pain. Use over-the-counter eyedrops to help with symptoms.     Please read all instructions as there may be items that were not fully discussed during your ED evaluation including all diagnosis and prescription medication instructions.              ED PROVIDER DOCUMENTATION     Patient:   Jon Wilson, Jon Wilson             MRN: [id number]            FIN: [id number]               Age:   42 years     Sex:  Male     DOB:  November 27, 1975   Associated Diagnoses:   Chemical conjunctivitis   Author:   Londell Moh      Basic Information   Time seen: Provider Seen (ST)   ED Provider/Time:    Londell Moh / 08/17/2018 20:43  .   History source: Patient.   Arrival mode: Private vehicle.   History limitation: None.   Additional information: Chief Complaint from Nursing Triage Note   Chief Complaint  Chief Complaint: Pt. c/o pain/irritation to L eye, states happened yesterday at work. denies vision changes. (08/17/18 20:37:00).      History of Present Illness   The patient presents with eye  pain and red eye(s).  The onset was last night.  The course/duration of symptoms is improving.  Type of injury: chemical exposure.  The location where the incident occurred was at work.  Location: Left eye(s). The character of symptoms is pain, redness and burning.  The degree of symptoms is minimal.  The exacerbating factor is none.  The relieving factor is none.  Prior episodes: none.  Risk factors consist of none.  Therapy today: none.  Associated symptoms: denies change in vision and denies foreign body sensation.  Presents to the ED with complaints of left eye pain, redness, and irritation after possibly being exposed to some composite while at work.  Patient reports flushing his eye extensively after the event.  He reports that his symptoms have improved however he wanted to make sure that everything was okay.  He denies any visual changes or other complaints at this time.        Review of Systems   Constitutional symptoms:  No fever, no chills, no fatigue.    Skin symptoms:  No rash, no dryness.    ENMT symptoms:  No nasal congestion, no sinus pain.    Respiratory symptoms:  No shortness of breath, no cough.    Cardiovascular symptoms:  No chest pain, no tachycardia, no syncope.    Gastrointestinal symptoms:  No abdominal pain, no nausea, no vomiting, no diarrhea, no constipation.    Genitourinary symptoms:  No dysuria, no hematuria.    Musculoskeletal symptoms:  No back pain, no Joint pain.    Neurologic symptoms:  No headache, no dizziness, no altered level of consciousness, no focal weakness.    Psychiatric symptoms:  No anxiety, no depression.    Hematologic/Lymphatic  symptoms:  Bleeding tendency negative, no petechiae.              Additional review of systems information: All other systems reviewed and otherwise negative.      Health Status   Allergies:    Allergic Reactions (Selected)  No Known Medication Allergies.   Medications:  (Selected)   Inpatient Medications  Ordered  fluorescein 1 mg  ophthalmic test: 1 mg, 1 EA, OPHTH, Once  tetracaine 0.5% ophthalmic solution: 1 drops, OPHTH, Once.   Immunizations: Up to date.      Past Medical/ Family/ Social History   Medical history:    No active or resolved past medical history items have been selected or recorded., Reviewed as documented in chart.   Surgical history:    None ([phone])., Reviewed as documented in chart.   Family history:    No family history items have been selected or recorded., Reviewed as documented in chart.   Social history:    Social & Psychosocial Habits    No Data Available, Reviewed as documented in chart.   Problem list:    Active Problems (1)  No Chronic Problems , per nurse's notes.      Physical Examination               Vital Signs   Vital Signs   08/17/2018 20:37 EST Systolic Blood Pressure 144 mmHg  HI    Diastolic Blood Pressure 99 mmHg  HI    Temperature Oral 36.8 degC    Heart Rate Monitored 82 bpm    Respiratory Rate 17 br/min    SpO2 97 %    Measurements   08/17/2018 20:40 EST Body Mass Index est meas 29.30 kg/m2    Body Mass Index Measured 29.30 kg/m2   08/17/2018 20:37 EST Height/Length Measured 177 cm    Weight Dosing 91.8 kg    Basic Oxygen Information   08/17/2018 20:37 EST SpO2 97 %    Oxygen Therapy Room air    General:  Alert, no acute distress.    Skin:  Warm, dry, intact.    Head:  Normocephalic, atraumatic.    Neck:  Supple, no tenderness, no JVD.    Eye:  Pupils are equal, round and reactive to light, intact accommodation, extraocular movements are intact, vision unchanged, Method of inspection: Viewed with fluorescein, Tetracaine applied, Conjunctiva: Left, with conjunctivitis, Cornea: no abrasions, not cloudy, not with rust ring, no corneal lacerations, Sclera: Clear.    Ears, nose, mouth and throat:  Oral mucosa moist.   Cardiovascular:  Normal peripheral perfusion, No edema.    Respiratory:  Respirations are non-labored, Symmetrical chest wall expansion.    Back:  Normal range of motion.    Musculoskeletal:  Normal ROM, no swelling, no deformity.    Chest wall   Gastrointestinal:  Non distended.   Genitourinary   Neurological:  Alert and oriented to person, place, time, and situation, No focal neurological deficit observed, CN II-XII intact.    Lymphatics:  No lymphadenopathy.   Psychiatric:  Cooperative, appropriate mood & affect.       Medical Decision Making   Differential Diagnosis:  Conjunctivitis.   Rationale:  08/17/2018 21:08:59: Based on the patient's presentation and history of present illness, I suspect chemical conjunctivitis as the etiology to explain patient's symptoms.  Patient has already improved and reports no other acute visual change that would necessitate the need for more advanced imaging or emergent ophthalmologic evaluation.  Patient was encouraged to continue using  over-the-counter eyedrops to help with symptoms and to follow-up with occupational medicine for continuing management of today's complaints.   Documents reviewed:  Emergency department nurses' notes, emergency department records.    Orders  Launch Order Profile (Selected)   Inpatient Orders  Completed  fluorescein 1 mg ophthalmic test: 1 mg, 1 EA, OPHTH, Once  tetracaine 0.5% ophthalmic solution: 1 drops, OPHTH, Once.      Impression and Plan   Diagnosis   Chemical conjunctivitis (ICD10-CM H10.219, Discharge, Medical)   Plan   Condition: Improved, Stable.    Disposition: Medically cleared, Discharged: Time  08/17/2018 21:56:00, to home.    Patient was given the following educational materials: Form - Return To Work Ranell Patrick) [phone]), Chemical Conjunctivitis.    Follow up with: Horatio Pel, [facility] In 3 days 08/20/2018 Return to the ED immediately if symptoms worse in anyway or if any new symptoms develop. Arrange follow-up with occupational medical doctor for further evaluation of today's complaints.  Take (4) 200 mg Motrins every 8 hours to help with pain.  Use over-the-counter eyedrops to help  with symptoms.    Please read all instructions as there may be items that were not fully discussed during your ED evaluation including all diagnosis and prescription medication instructions..    Counseled: Patient, Regarding diagnosis, Regarding diagnostic results, Regarding treatment plan, Regarding prescription, Patient indicated understanding of instructions.    Notes: I have spoken with the patient and/or caregivers. I have explained the patient's condition, diagnoses and treatment plan based on the information available to me at this time. I have answered the patient's and/or caregiver's questions and addressed any concerns. The patient and/or caregivers have as good an understanding of the patient's diagnosis, condition and treatment plan as can be expected at this point. The vital signs have been stable. The patient's condition is stable and appropriate for discharge from the emergency department. The patient will pursue further outpatient evaluation with the primary care physician or other designated or consulting physician as outlined in the discharge instructions. The patient and/or caregivers are agreeable to this plan of care and follow-up instructions have been explained in detail. The patient and/or caregivers have received these instructions in written format and have expressed an understanding of the discharge instructions. The patient and/or caregivers are aware that any significant change in condition or worsening of symptoms should prompt an immediate return to this or the closest emergency department or a call to 911.

## 2018-08-17 NOTE — ED Notes (Signed)
 ED Patient Summary       ;      [facility] Emergency Department  [address] [place] [address]  [phone]  Discharge Instructions (Patient)  _______________________________________    Name: Jon Wilson, Jon Wilson  DOB: 1976-08-31 MRN: [id number] FIN: [id number]  Reason For Visit: Eye pain; IRRITATED EYE  Final Diagnosis: Chemical conjunctivitis    Visit Date: 08/17/2018 20:29:00  Address: [address] [place] [address]  Phone: [phone]    Primary Care Provider:  Name: ATILANO CHRYSTAL NORLEEN ONEIL  Phone: [phone]     Emergency Department Providers:         Primary Physician:   NORRIS-MD, JUSTIN AARON        [facility] would like to thank you for allowing us  to assist you with your healthcare needs. The following includes patient education materials and information regarding your injury/illness.    Follow-up Instructions: You were treated today on an emergency basis, it may be wise to contact your primary care provider to notify them of your visit today. You may have been referred to your regular doctor or a specialist, please follow up as instructed. If your condition worsens or you can't get in to see the doctor, contact the Emergency Department.              With: Address: When:   DALLAS PATRICK, [facility] [address] [place], [place] [address]  [phone] Business (1) In 3 days 08/20/2018   Comments:   Return to the ED immediately if symptoms worse in anyway or if any new symptoms develop. Arrange follow-up with occupational medical doctor for further evaluation of today's complaints. Take (4) 200 mg Motrins every 8 hours to help with pain. Use over-the-counter eyedrops to help with symptoms.     Please read all instructions as there may be items that were not fully discussed during your ED evaluation including all diagnosis and prescription medication instructions.             Patient Education Materials:  Form - Return To Work  [name]) [phone]); Chemical Conjunctivitis           Return to Work  __________________________________________________was treated at our facility.  RETURN TO WORK  . Employee may return to work on ____________________.      Show this Return to Work statement to your supervisor at work as soon as possible. Your employer should be aware of your condition and can help with the necessary work activity restrictions. If you wish to return to work sooner than the date that is listed above, or if you have further problems that make it difficult for you to return at that time, please call our clinic or your health care provider.     Justin A. Kay, MD  Health Care Provider Name (printed)  _________________________________________  Health Care Provider (signature)   _________________________________________  Date  This information is not intended to replace advice given to you by your health care provider. Make sure you discuss any questions you have with your health care provider.  Document Released: 09/22/2005 Document Revised: 10/13/2014 Document Reviewed: 04/21/2014  Elsevier Interactive Patient Education 2016 [employer]         Chemical Conjunctivitis    Chemical conjunctivitis is eye inflammation from exposure to an irritant or chemical substance. This causes the clear membrane that covers the white part of your eye and the inner surface of your eyelid (conjunctiva) to  become inflamed. When the blood vessels in the conjunctiva become inflamed, the eye may become red, pink, and itchy.      Chemical conjunctivitis can occur in one or both eyes. It cannot be spread by one person to another person (noncontagious).    CAUSES    This condition is caused by exposure to a chemical substance or irritant, such as:     Smoke.     Chlorine.     Soap.     Fumes.     Air pollution.    RISK FACTORS    This condition is more likely to develop in:     People who live somewhere with high levels of air pollution.     People who  use swimming pools often.    SYMPTOMS    Symptoms of this condition may include:     Eye redness.     Tearing of the eyes.     Watery eyes.     Itchy eyes.     Burning feeling in the eyes.     Clear drainage from the eyes.     Swollen eyelids.     Sensitivity to light.    DIAGNOSIS    Your health care provider can diagnose this condition from your symptoms and medical history. The health care provider will also do a physical exam. If you have drainage from your eyes, it may be tested to rule out other causes of conjunctivitis. Your health care provider may also use a medical instrument that uses magnified light to examine the eyes (slit lamp).     TREATMENT    Treatment for this condition involves carefully flushing the chemical out of your eye. You may also get antiallergy medicines or eye drops to use at home.    HOME CARE INSTRUCTIONS     Take or apply medicines only as directed by your health care provider.     Do not touch or rub your eyes.     Do not wear contact lenses until the inflammation is gone. Wear glasses instead.     Do not wear eye makeup until the inflammation is gone.     Apply a cool, clean washcloth to your eye for 10?20 minutes, 3?4 times a day.     Avoid exposure to the chemical or environment that caused the irritation. Wear eye protection as necessary.    SEEK MEDICAL CARE IF:     Your symptoms get worse.     You have pus draining from your eye.     You have new symptoms.     You have a fever.     You have a change in vision.     You have increasing pain.    This information is not intended to replace advice given to you by your health care provider. Make sure you discuss any questions you have with your health care provider.    Document Released: 07/02/2005 Document Revised: 10/13/2014 Document Reviewed: 07/04/2014  Elsevier Interactive Patient Education ?2016 Elsevier Inc.        Allergy Info: No Known Medication Allergies    Medication Information:  [facility] ED Physicians provided you  with a complete list of medications post discharge, if you have been instructed to stop taking a medication please ensure you also follow up with this information to your Primary Care Physician. Unless otherwise noted, patient will continue to take medications as prescribed prior to the Emergency Room visit. Any specific questions regarding your  chronic medications and dosages should be discussed with your physician(s) and pharmacist.    Medications Administered During Visit:              Medication Dose Route   tetracaine ophthalmic 1 drops OPHTH   fluorescein ophthalmic 1 mg OPHTH         Major Tests and Procedures:  The following procedures and tests were performed during your ED visit.  PROCEDURES%>  PROCEDURES COMMENTS%>          ---------------------------------------------------------------------------------------------------------------------  [facility] allows you to manage your health, view your test results, and retrieve your discharge documents from your hospital stay securely and conveniently from your computer.    To begin the enrollment process, visit [email or web address] Click on "Sign up now" under [facility].      Comment:

## 2018-08-17 NOTE — ED Notes (Signed)
ED Triage Note       ED Secondary Triage Entered On:  08/17/2018 20:41 EST    Performed On:  08/17/2018 20:40 EST by CANADY, RN, BROOKE N               General Information   Barriers to Learning :   None evident   ED Home Meds Section :   Document assessment   [facility] :   Document assessment   Infectious Disease Documentation :   Document assessment   ED Advance Directives Section :   Document assessment   CANADY, RN, BROOKE N - 08/17/2018 20:40 EST   (As Of: 08/17/2018 20:41:21 EST)   Problems(Active)    No Chronic Problems (Cerner  :NKP )  Name of Problem:   No Chronic Problems ; Recorder:   Patsi SearsANADY, RN, Mertha FindersBROOKE N; Code:   NKP ; Last Updated:   08/17/2018 20:40 EST ; Life Cycle Date:   08/17/2018 ; Life Cycle Status:   Active ; Vocabulary:   Cerner          Diagnoses(Active)    Eye pain  Date:   08/17/2018 ; Diagnosis Type:   Reason For Visit ; Confirmation:   Complaint of ; Clinical Dx:   Eye pain ; Classification:   Medical ; Clinical Service:   Emergency medicine ; Code:   PNED ; Probability:   0 ; Diagnosis Code:   [id number]             -    Procedure History   (As Of: 08/17/2018 20:41:21 EST)     Anesthesia Minutes:   0 ; Procedure Name:   None ; Procedure Minutes:   0 ; Last Reviewed Dt/Tm:   08/17/2018 20:40:56 EST            [facility] Fall Risk Assessment Tool   Hx of falling last 3 months ED Fall :   No   Patient confused or disoriented ED Fall :   No   Patient intoxicated or sedated ED Fall :   No   Patient impaired gait ED Fall :   No   Use a mobility assistance device ED Fall :   No   Patient altered elimination ED Fall :   No   [facility] ED Fall Score :   0    CANADY, RN, BROOKE N - 08/17/2018 20:40 EST   ED Advance Directive   Advance Directive :   No   CANADY, RN, BROOKE N - 08/17/2018 20:40 EST   ID Risk Screen Symptoms   Recent Travel History :   No recent travel   TB Symptom Screen :   No symptoms   C. diff Symptom/History ID :   Neither of the above    CANADY, RN, BROOKE N - 08/17/2018 20:40 EST

## 2018-08-17 NOTE — ED Provider Notes (Signed)
Eye pain        Patient:   Jon Wilson, Jon Wilson             MRN: [id number]            FIN: [phone]               Age:   42 years     Sex:  Male     DOB:  [birth year]   Associated Diagnoses:   Chemical conjunctivitis   Author:   Londell Moh      Basic Information   Time seen: Provider Seen (ST)   ED Provider/Time:    Londell Moh / 08/17/2018 20:43  .   History source: Patient.   Arrival mode: Private vehicle.   History limitation: None.   Additional information: Chief Complaint from Nursing Triage Note   Chief Complaint  Chief Complaint: Pt. c/o pain/irritation to L eye, states happened yesterday at work. denies vision changes. (08/17/18 20:37:00).      History of Present Illness   The patient presents with eye pain and red eye(s).  The onset was last night.  The course/duration of symptoms is improving.  Type of injury: chemical exposure.  The location where the incident occurred was at work.  Location: Left eye(s). The character of symptoms is pain, redness and burning.  The degree of symptoms is minimal.  The exacerbating factor is none.  The relieving factor is none.  Prior episodes: none.  Risk factors consist of none.  Therapy today: none.  Associated symptoms: denies change in vision and denies foreign body sensation.  Presents to the ED with complaints of left eye pain, redness, and irritation after possibly being exposed to some composite while at work.  Patient reports flushing his eye extensively after the event.  He reports that his symptoms have improved however he wanted to make sure that everything was okay.  He denies any visual changes or other complaints at this time.        Review of Systems   Constitutional symptoms:  No fever, no chills, no fatigue.    Skin symptoms:  No rash, no dryness.    ENMT symptoms:  No nasal congestion, no sinus pain.    Respiratory symptoms:  No shortness of breath, no cough.    Cardiovascular symptoms:  No chest pain, no tachycardia, no syncope.     Gastrointestinal symptoms:  No abdominal pain, no nausea, no vomiting, no diarrhea, no constipation.    Genitourinary symptoms:  No dysuria, no hematuria.    Musculoskeletal symptoms:  No back pain, no Joint pain.    Neurologic symptoms:  No headache, no dizziness, no altered level of consciousness, no focal weakness.    Psychiatric symptoms:  No anxiety, no depression.    Hematologic/Lymphatic symptoms:  Bleeding tendency negative, no petechiae.              Additional review of systems information: All other systems reviewed and otherwise negative.      Health Status   Allergies:    Allergic Reactions (Selected)  No Known Medication Allergies.   Medications:  (Selected)   Inpatient Medications  Ordered  fluorescein 1 mg ophthalmic test: 1 mg, 1 EA, OPHTH, Once  tetracaine 0.5% ophthalmic solution: 1 drops, OPHTH, Once.   Immunizations: Up to date.      Past Medical/ Family/ Social History   Medical history:    No active or resolved past medical history items have been selected  or recorded., Reviewed as documented in chart.   Surgical history:    None ([id number])., Reviewed as documented in chart.   Family history:    No family history items have been selected or recorded., Reviewed as documented in chart.   Social history:    Social & Psychosocial Habits    No Data Available, Reviewed as documented in chart.   Problem list:    Active Problems (1)  No Chronic Problems , per nurse's notes.      Physical Examination               Vital Signs   Vital Signs   08/17/2018 20:37 EST Systolic Blood Pressure 144 mmHg  HI    Diastolic Blood Pressure 99 mmHg  HI    Temperature Oral 36.8 degC    Heart Rate Monitored 82 bpm    Respiratory Rate 17 br/min    SpO2 97 %    Measurements   08/17/2018 20:40 EST Body Mass Index est meas 29.30 kg/m2    Body Mass Index Measured 29.30 kg/m2   08/17/2018 20:37 EST Height/Length Measured 177 cm    Weight Dosing 91.8 kg    Basic Oxygen Information   08/17/2018 20:37 EST SpO2 97 %    Oxygen  Therapy Room air    General:  Alert, no acute distress.    Skin:  Warm, dry, intact.    Head:  Normocephalic, atraumatic.    Neck:  Supple, no tenderness, no JVD.    Eye:  Pupils are equal, round and reactive to light, intact accommodation, extraocular movements are intact, vision unchanged, Method of inspection: Viewed with fluorescein, Tetracaine applied, Conjunctiva: Left, with conjunctivitis, Cornea: no abrasions, not cloudy, not with rust ring, no corneal lacerations, Sclera: Clear.    Ears, nose, mouth and throat:  Oral mucosa moist.   Cardiovascular:  Normal peripheral perfusion, No edema.    Respiratory:  Respirations are non-labored, Symmetrical chest wall expansion.    Back:  Normal range of motion.   Musculoskeletal:  Normal ROM, no swelling, no deformity.    Chest wall   Gastrointestinal:  Non distended.   Genitourinary   Neurological:  Alert and oriented to person, place, time, and situation, No focal neurological deficit observed, CN II-XII intact.    Lymphatics:  No lymphadenopathy.   Psychiatric:  Cooperative, appropriate mood & affect.       Medical Decision Making   Differential Diagnosis:  Conjunctivitis.   Rationale:  08/17/2018 21:08:59: Based on the patient's presentation and history of present illness, I suspect chemical conjunctivitis as the etiology to explain patient's symptoms.  Patient has already improved and reports no other acute visual change that would necessitate the need for more advanced imaging or emergent ophthalmologic evaluation.  Patient was encouraged to continue using over-the-counter eyedrops to help with symptoms and to follow-up with occupational medicine for continuing management of today's complaints.   Documents reviewed:  Emergency department nurses' notes, emergency department records.    Orders  Launch Order Profile (Selected)   Inpatient Orders  Completed  fluorescein 1 mg ophthalmic test: 1 mg, 1 EA, OPHTH, Once  tetracaine 0.5% ophthalmic solution: 1 drops,  OPHTH, Once.      Impression and Plan   Diagnosis   Chemical conjunctivitis (ICD10-CM H10.219, Discharge, Medical)   Plan   Condition: Improved, Stable.    Disposition: Medically cleared, Discharged: Time  08/17/2018 21:56:00, to home.    Patient was given the following educational materials: Form - Return  To Work Ranell Patrick([place]ris) [phone]([id number]), Chemical Conjunctivitis.    Follow up with: Horatio PelEDWARD GALAID-MD, View Only Providers In 3 days 08/20/2018 Return to the ED immediately if symptoms worse in anyway or if any new symptoms develop. Arrange follow-up with occupational medical doctor for further evaluation of today's complaints.  Take (4) 200 mg Motrins every 8 hours to help with pain.  Use over-the-counter eyedrops to help with symptoms.    Please read all instructions as there may be items that were not fully discussed during your ED evaluation including all diagnosis and prescription medication instructions..    Counseled: Patient, Regarding diagnosis, Regarding diagnostic results, Regarding treatment plan, Regarding prescription, Patient indicated understanding of instructions.    Notes: I have spoken with the patient and/or caregivers. I have explained the patient's condition, diagnoses and treatment plan based on the information available to me at this time. I have answered the patient's and/or caregiver's questions and addressed any concerns. The patient and/or caregivers have as good an understanding of the patient's diagnosis, condition and treatment plan as can be expected at this point. The vital signs have been stable. The patient's condition is stable and appropriate for discharge from the emergency department. The patient will pursue further outpatient evaluation with the primary care physician or other designated or consulting physician as outlined in the discharge instructions. The patient and/or caregivers are agreeable to this plan of care and follow-up instructions have been explained in detail. The patient  and/or caregivers have received these instructions in written format and have expressed an understanding of the discharge instructions. The patient and/or caregivers are aware that any significant change in condition or worsening of symptoms should prompt an immediate return to this or the closest emergency department or a call to 911.    Signature Line     Electronically Signed on 08/17/2018 10:03 PM EST   ________________________________________________   Londell MohNORRIS-MD,  JUSTIN AARON               Modified by: Londell MohNORRIS-MD,  JUSTIN AARON on 08/17/2018 09:59 PM EST      Modified by: Londell MohNORRIS-MD,  JUSTIN AARON on 08/17/2018 10:03 PM EST

## 2021-12-04 ENCOUNTER — Encounter: Payer: Self-pay | Admitting: Internal Medicine
